# Patient Record
Sex: Female | Born: 1980 | Race: Black or African American | Hispanic: No | Smoking: Never smoker
Health system: Southern US, Community
[De-identification: ages and names within clinical notes are randomized; demographics above are authoritative.]

## PROBLEM LIST (undated history)

## (undated) DIAGNOSIS — D219 Benign neoplasm of connective and other soft tissue, unspecified: Secondary | ICD-10-CM

## (undated) DIAGNOSIS — O24419 Gestational diabetes mellitus in pregnancy, unspecified control: Secondary | ICD-10-CM

## (undated) HISTORY — PX: NO PAST SURGERIES: SHX2092

## (undated) HISTORY — PX: WISDOM TOOTH EXTRACTION: SHX21

## (undated) HISTORY — DX: Gestational diabetes mellitus in pregnancy, unspecified control: O24.419

## (undated) HISTORY — DX: Benign neoplasm of connective and other soft tissue, unspecified: D21.9

---

## 2005-06-12 ENCOUNTER — Emergency Department (HOSPITAL_COMMUNITY): Admission: EM | Admit: 2005-06-12 | Discharge: 2005-06-12 | Payer: Self-pay | Admitting: Family Medicine

## 2005-12-04 ENCOUNTER — Emergency Department (HOSPITAL_COMMUNITY): Admission: EM | Admit: 2005-12-04 | Discharge: 2005-12-04 | Payer: Self-pay | Admitting: Family Medicine

## 2006-01-23 ENCOUNTER — Other Ambulatory Visit: Admission: RE | Admit: 2006-01-23 | Discharge: 2006-01-23 | Payer: Self-pay | Admitting: Gynecology

## 2006-10-05 ENCOUNTER — Other Ambulatory Visit: Admission: RE | Admit: 2006-10-05 | Discharge: 2006-10-05 | Payer: Self-pay | Admitting: Obstetrics and Gynecology

## 2008-11-24 ENCOUNTER — Emergency Department (HOSPITAL_COMMUNITY): Admission: EM | Admit: 2008-11-24 | Discharge: 2008-11-25 | Payer: Self-pay | Admitting: Emergency Medicine

## 2009-04-07 ENCOUNTER — Inpatient Hospital Stay (HOSPITAL_COMMUNITY): Admission: AD | Admit: 2009-04-07 | Discharge: 2009-04-10 | Payer: Self-pay | Admitting: Obstetrics & Gynecology

## 2010-01-11 ENCOUNTER — Emergency Department (HOSPITAL_COMMUNITY): Admission: EM | Admit: 2010-01-11 | Discharge: 2010-01-11 | Payer: Self-pay | Admitting: Emergency Medicine

## 2010-07-06 IMAGING — US US ABDOMEN COMPLETE
1 series · 14 of 25 positions shown · non-contrast
Comparison: None

CLINICAL DATA: Pain.  Back pain.

ABDOMINAL ULTRASOUND COMPLETE

[Series 1: us abdomen complete · 0.28mm/px · 14 of 56 slices shown]
[im 1/56]
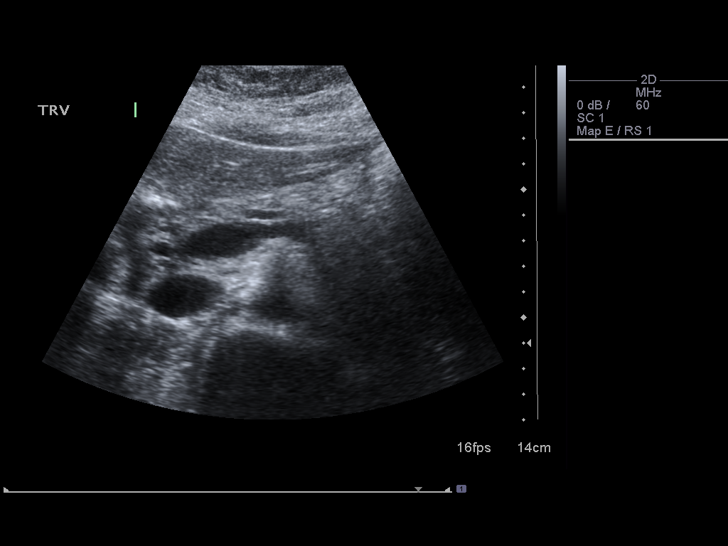
[im 5/56]
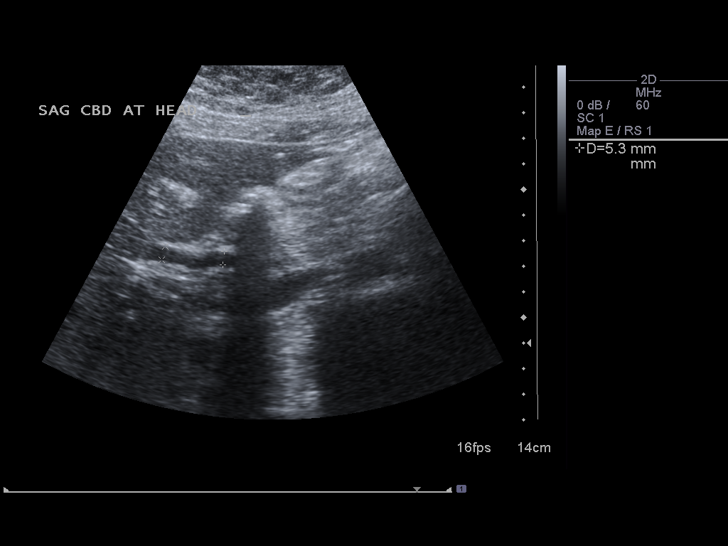
[im 10/56]
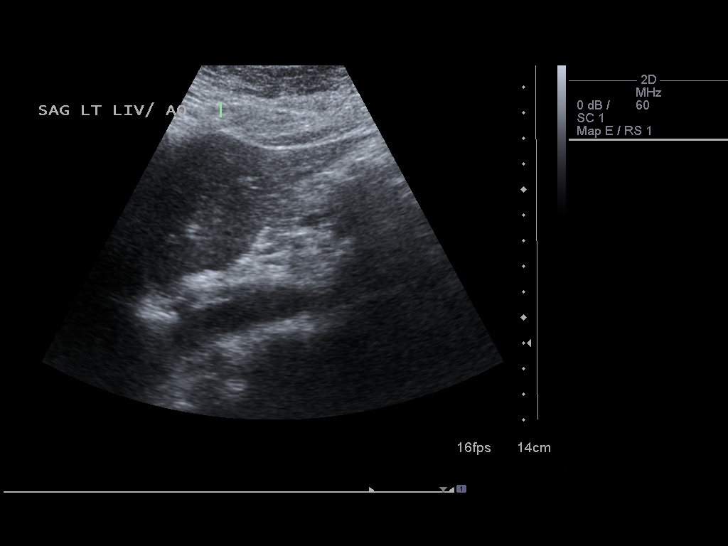
[im 14/56]
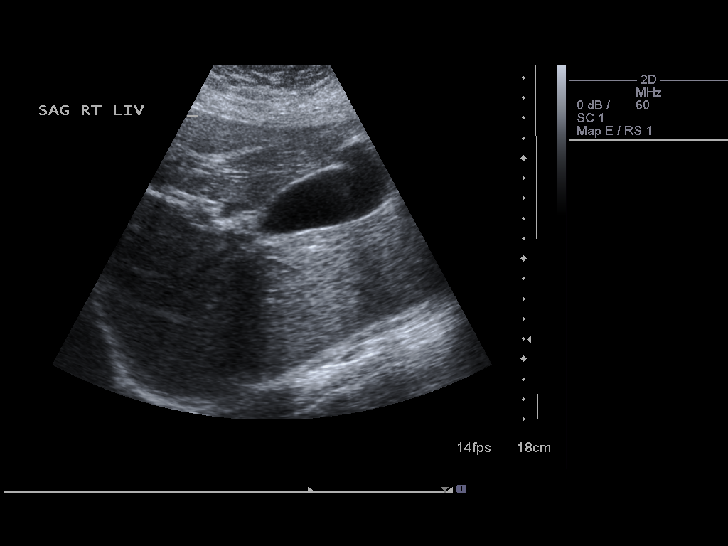
[im 19/56]
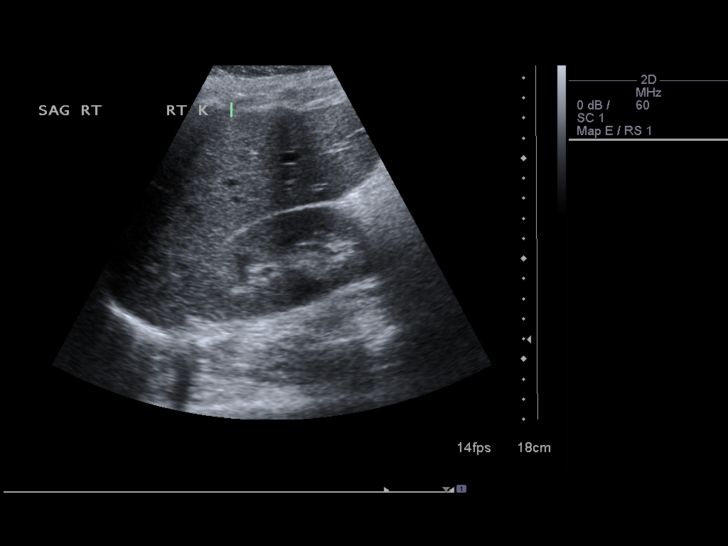
[im 21/56]
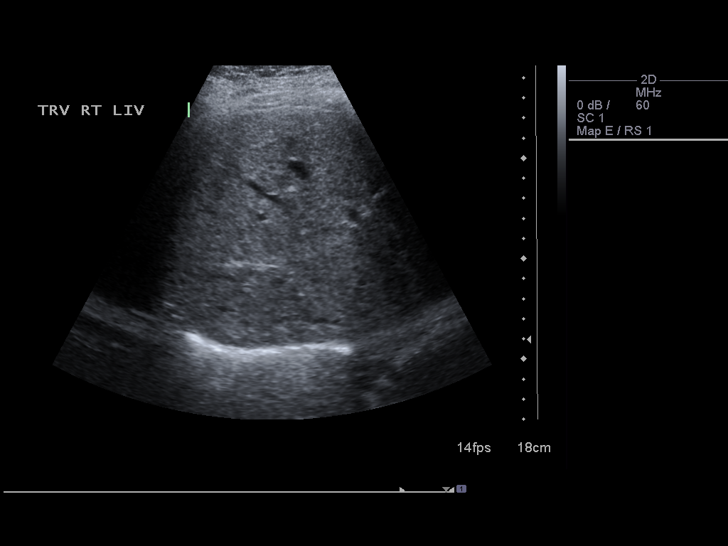
[im 26/56]
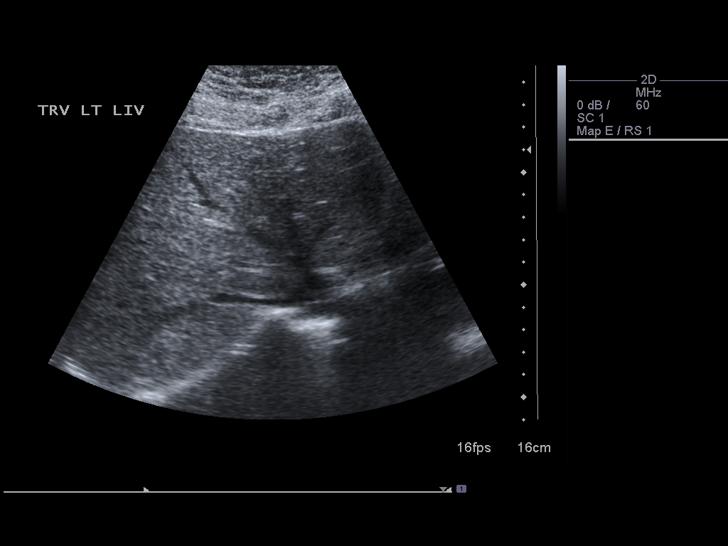
[im 30/56]
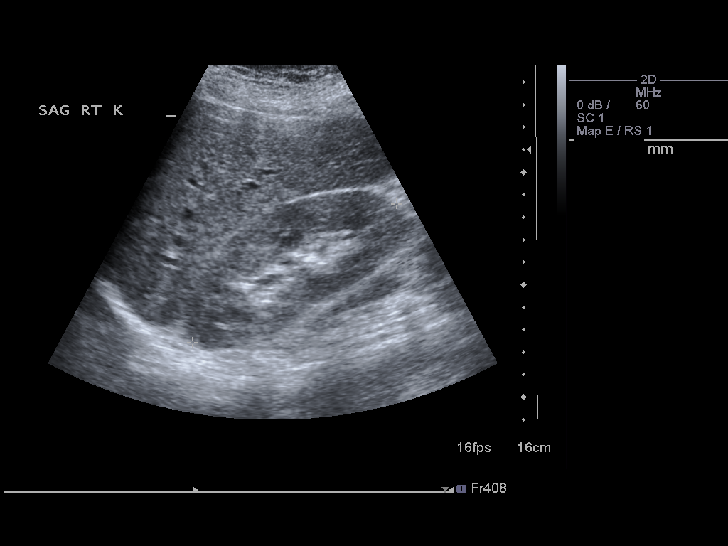
[im 35/56]
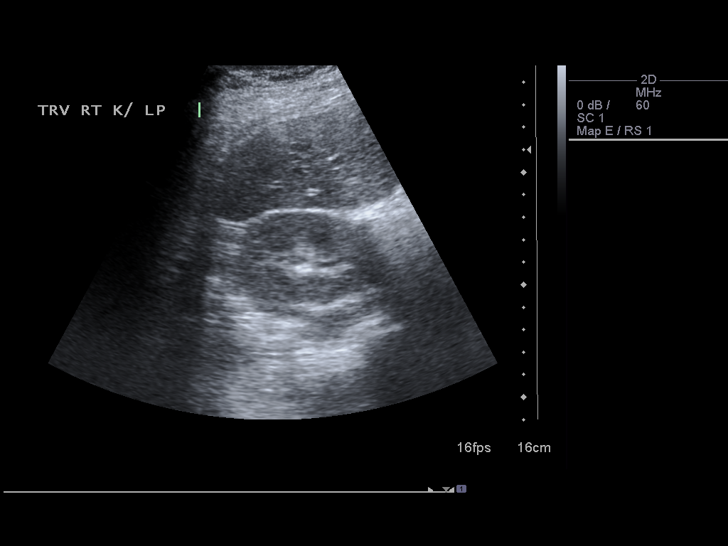
[im 37/56]
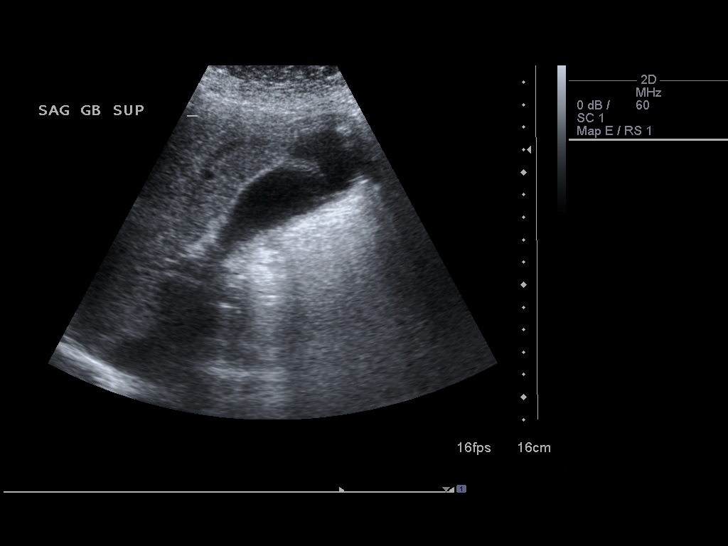
[im 42/56]
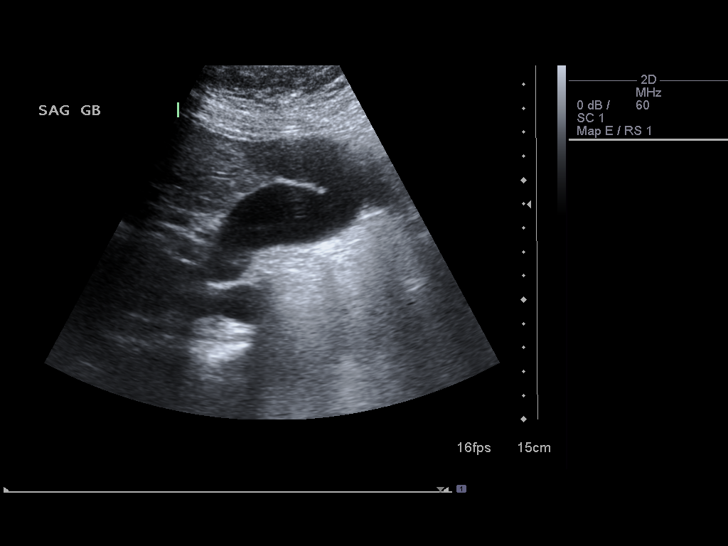
[im 46/56]
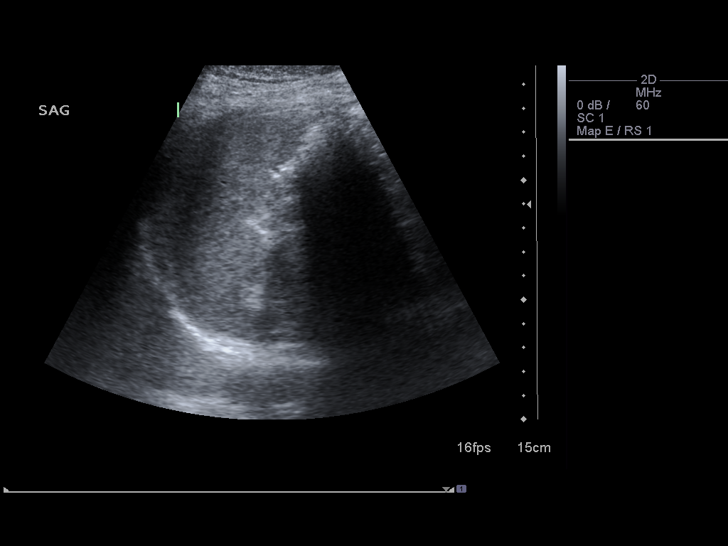
[im 51/56]
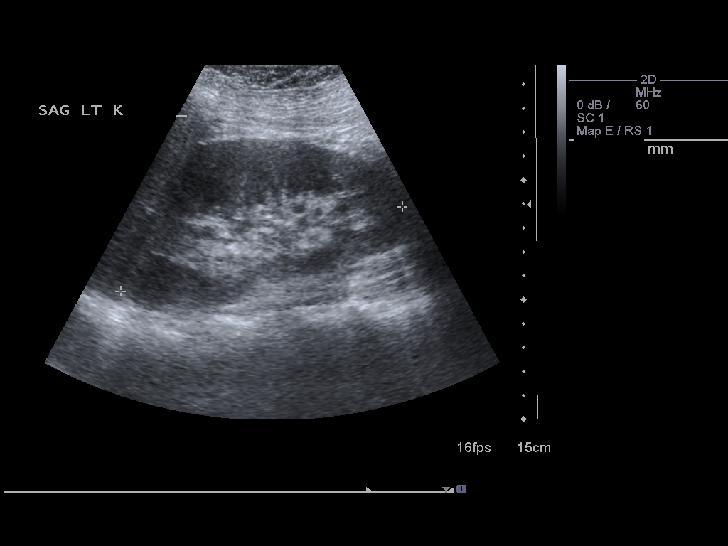
[im 56/56]
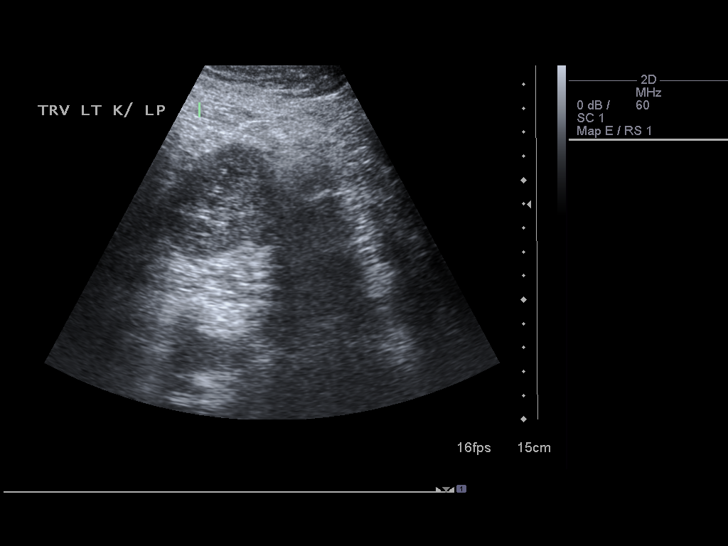

[14 of 25 positions shown; findings below may reference images not displayed]

FINDINGS: Gallbladder:  No gallstones, gallbladder wall thickening, or
pericholecystic fluid.

Common bile duct: Within normal limits in caliber.

Liver:  No focal lesion identified.  Within normal limits in
parenchymal echogenicity.

Inferior vena cava:  Intrahepatic portion appears normal.

Pancreas:  Although entire pancreas not visualized due to bowel
gas, no abnormality identified.

Spleen:  Within normal limits in size and echogenicity.

Right kidney:  Normal in size and parenchymal echogenicity.  No
evidence of mass or hydronephrosis.

Left kidney:  Normal in size and parenchymal echogenicity.   No
evidence of mass or hydronephrosis.

Abdominal aorta:  No aneurysm identified.
IMPRESSION: Negative abdominal ultrasound.

## 2010-11-08 LAB — POCT PREGNANCY, URINE: Preg Test, Ur: NEGATIVE

## 2010-11-27 LAB — CBC
HCT: 25.9 % — ABNORMAL LOW (ref 36.0–46.0)
HCT: 32.8 % — ABNORMAL LOW (ref 36.0–46.0)
Hemoglobin: 8.8 g/dL — ABNORMAL LOW (ref 12.0–15.0)
MCV: 82.1 fL (ref 78.0–100.0)
MCV: 83.9 fL (ref 78.0–100.0)
RDW: 14 % (ref 11.5–15.5)

## 2010-12-01 LAB — DIFFERENTIAL
Eosinophils Absolute: 0.1 10*3/uL (ref 0.0–0.7)
Lymphocytes Relative: 10 % — ABNORMAL LOW (ref 12–46)
Monocytes Absolute: 0.5 10*3/uL (ref 0.1–1.0)
Neutro Abs: 6.5 10*3/uL (ref 1.7–7.7)
Neutrophils Relative %: 83 % — ABNORMAL HIGH (ref 43–77)

## 2010-12-01 LAB — CBC
MCHC: 34.8 g/dL (ref 30.0–36.0)
Platelets: 176 10*3/uL (ref 150–400)
RBC: 3.47 MIL/uL — ABNORMAL LOW (ref 3.87–5.11)

## 2010-12-01 LAB — WET PREP, GENITAL: Trich, Wet Prep: NONE SEEN

## 2010-12-01 LAB — URINE CULTURE
Colony Count: NO GROWTH
Culture: NO GROWTH

## 2010-12-01 LAB — POCT I-STAT, CHEM 8
BUN: 7 mg/dL (ref 6–23)
Glucose, Bld: 94 mg/dL (ref 70–99)
HCT: 30 % — ABNORMAL LOW (ref 36.0–46.0)
Hemoglobin: 10.2 g/dL — ABNORMAL LOW (ref 12.0–15.0)
Potassium: 3.2 mEq/L — ABNORMAL LOW (ref 3.5–5.1)

## 2010-12-01 LAB — URINALYSIS, ROUTINE W REFLEX MICROSCOPIC
Bilirubin Urine: NEGATIVE
Nitrite: NEGATIVE
Protein, ur: NEGATIVE mg/dL
Specific Gravity, Urine: 1.015 (ref 1.005–1.030)
Urobilinogen, UA: 1 mg/dL (ref 0.0–1.0)
pH: 7 (ref 5.0–8.0)

## 2010-12-01 LAB — GC/CHLAMYDIA PROBE AMP, GENITAL: GC Probe Amp, Genital: NEGATIVE

## 2011-08-23 IMAGING — CR DG NECK SOFT TISSUE
1 series · 1 of 1 positions shown · non-contrast
Comparison: None available

CLINICAL DATA: Congestion.  Swollen neck.

NECK SOFT TISSUES - 1+ VIEW

[w soft tissue neck]
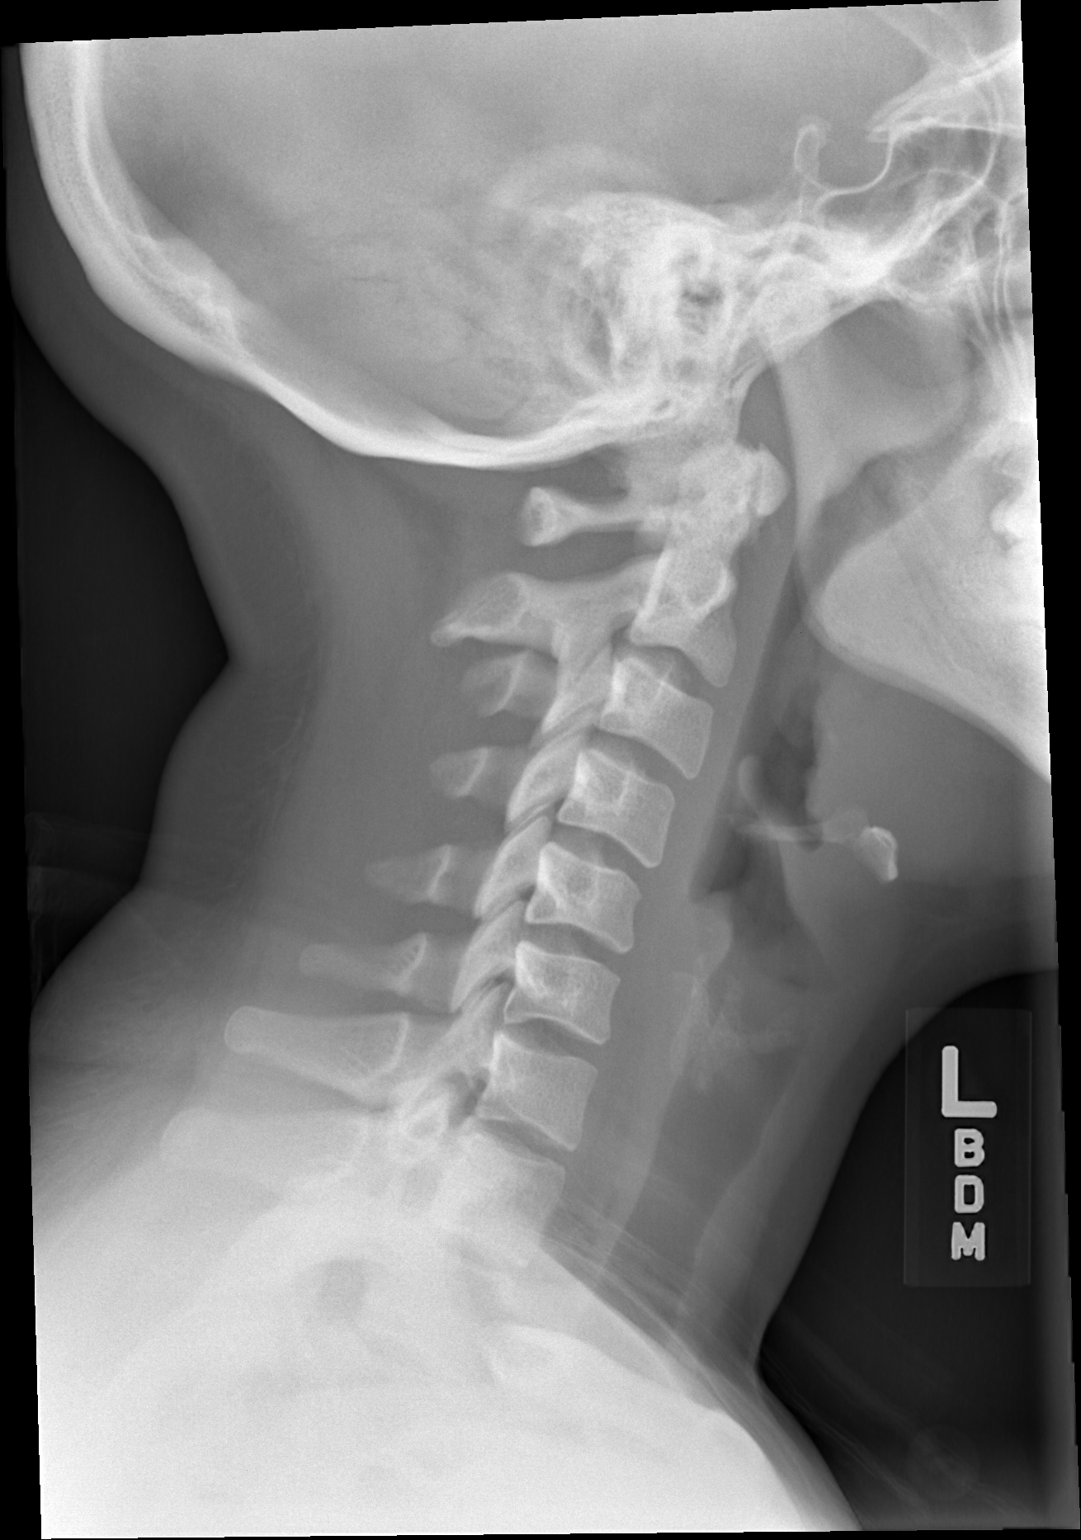

[1 of 1 positions shown; findings below may reference images not displayed]

FINDINGS: Prominent adenoid tissue is noted.  The prevertebral soft
tissues are otherwise within normal limits.  There is some
narrowing of the nasopharyngeal airway as result of the adenoid
tissue.  The epiglottis is within normal limits.  The oropharynx
and hypopharynx is clear.
IMPRESSION: 1.  Prominent adenoid tissue with some narrowing of the
nasopharynx.
2.  The lower airway is patent.

## 2014-05-12 ENCOUNTER — Emergency Department (HOSPITAL_COMMUNITY)
Admission: EM | Admit: 2014-05-12 | Discharge: 2014-05-12 | Disposition: A | Discharge status: Home / Self Care | Payer: Self-pay | Attending: Emergency Medicine | Admitting: Emergency Medicine

## 2014-05-12 ENCOUNTER — Encounter (HOSPITAL_COMMUNITY): Payer: Self-pay | Admitting: Emergency Medicine

## 2014-05-12 DIAGNOSIS — Z3202 Encounter for pregnancy test, result negative: Secondary | ICD-10-CM | POA: Insufficient documentation

## 2014-05-12 DIAGNOSIS — H5710 Ocular pain, unspecified eye: Secondary | ICD-10-CM | POA: Insufficient documentation

## 2014-05-12 DIAGNOSIS — N390 Urinary tract infection, site not specified: Secondary | ICD-10-CM | POA: Insufficient documentation

## 2014-05-12 DIAGNOSIS — L293 Anogenital pruritus, unspecified: Secondary | ICD-10-CM | POA: Insufficient documentation

## 2014-05-12 DIAGNOSIS — H5789 Other specified disorders of eye and adnexa: Secondary | ICD-10-CM | POA: Insufficient documentation

## 2014-05-12 DIAGNOSIS — N760 Acute vaginitis: Secondary | ICD-10-CM | POA: Insufficient documentation

## 2014-05-12 LAB — PREGNANCY, URINE: PREG TEST UR: NEGATIVE

## 2014-05-12 LAB — URINALYSIS, ROUTINE W REFLEX MICROSCOPIC
BILIRUBIN URINE: NEGATIVE
GLUCOSE, UA: NEGATIVE mg/dL
Ketones, ur: NEGATIVE mg/dL
NITRITE: NEGATIVE
PH: 5.5 (ref 5.0–8.0)
PROTEIN: NEGATIVE mg/dL
Specific Gravity, Urine: 1.029 (ref 1.005–1.030)
UROBILINOGEN UA: 1 mg/dL (ref 0.0–1.0)

## 2014-05-12 LAB — URINE MICROSCOPIC-ADD ON

## 2014-05-12 LAB — WET PREP, GENITAL
Clue Cells Wet Prep HPF POC: NONE SEEN
TRICH WET PREP: NONE SEEN
YEAST WET PREP: NONE SEEN

## 2014-05-12 MED ORDER — FLUORESCEIN SODIUM 1 MG OP STRP
1.0000 | ORAL_STRIP | Freq: Once | OPHTHALMIC | Status: AC
  Administered 2014-05-12: 11:00:00 via OPHTHALMIC
  Filled 2014-05-12: qty 1

## 2014-05-12 MED ORDER — LIDOCAINE HCL (PF) 1 % IJ SOLN
INTRAMUSCULAR | Status: AC
  Administered 2014-05-12: 1.8 mL
  Filled 2014-05-12: qty 5

## 2014-05-12 MED ORDER — AZITHROMYCIN 250 MG PO TABS
1000.0000 mg | ORAL_TABLET | Freq: Once | ORAL | Status: AC
  Administered 2014-05-12: 1000 mg via ORAL
  Filled 2014-05-12: qty 4

## 2014-05-12 MED ORDER — TETRACAINE HCL 0.5 % OP SOLN
1.0000 [drp] | Freq: Once | OPHTHALMIC | Status: AC
  Administered 2014-05-12: 1 [drp] via OPHTHALMIC
  Filled 2014-05-12: qty 2

## 2014-05-12 MED ORDER — CEPHALEXIN 500 MG PO CAPS: 500.0000 mg | ORAL_CAPSULE | Freq: Four times a day (QID) | ORAL | Status: DC

## 2014-05-12 MED ORDER — FLUCONAZOLE 100 MG PO TABS
150.0000 mg | ORAL_TABLET | Freq: Once | ORAL | Status: AC
  Administered 2014-05-12: 150 mg via ORAL
  Filled 2014-05-12: qty 2

## 2014-05-12 MED ORDER — LIDOCAINE HCL (PF) 1 % IJ SOLN
5.0000 mL | Freq: Once | INTRAMUSCULAR | Status: AC
  Administered 2014-05-12: 1.8 mL

## 2014-05-12 MED ORDER — CEFTRIAXONE SODIUM 250 MG IJ SOLR
250.0000 mg | Freq: Once | INTRAMUSCULAR | Status: AC
  Administered 2014-05-12: 250 mg via INTRAMUSCULAR
  Filled 2014-05-12: qty 250

## 2014-05-12 MED ORDER — TOBRAMYCIN 0.3 % OP SOLN
2.0000 [drp] | Freq: Once | OPHTHALMIC | Status: AC
  Administered 2014-05-12: 2 [drp] via OPHTHALMIC
  Filled 2014-05-12: qty 5

## 2014-05-12 NOTE — ED Provider Notes (Signed)
Patient seen by Dr. Ashok Cordia, K. Requesting female provider do the pelvic.  Physical Exam  Genitourinary: Uterus normal. Cervix exhibits discharge and friability. Cervix exhibits no motion tenderness. Right adnexum displays no mass, no tenderness and no fullness. Left adnexum displays no mass, no tenderness and no fullness. There is tenderness around the vagina. No erythema or bleeding around the vagina. No foreign body around the vagina. No signs of injury around the vagina. Vaginal discharge (yellow/green) found.    For HPI, ROS, full PE, Diagnosis and dispo please refer to Dr. Albina Billet note.  Linus Mako, PA-C 05/12/14 [phone]

## 2014-05-12 NOTE — Discharge Instructions (Signed)
It was our pleasure to provide your ER care today - we hope that you feel better.  Drink plenty of fluids. Take antibiotic as prescribed (keflex) for urine infection. Take motrin or aleve as need for pain.  Use tobrex eye drops 1-2 drops in each eye 4x/day for the next 5 days. Throw away old pair of contacts, and do not insert new pair until eye symptoms have resolved.  Return to ER if worse, new symptoms, fevers, severe abdominal pain, vomiting, other concern.      Conjunctivitis Conjunctivitis is commonly called "pink eye." Conjunctivitis can be caused by bacterial or viral infection, allergies, or injuries. There is usually redness of the lining of the eye, itching, discomfort, and sometimes discharge. There may be deposits of matter along the eyelids. A viral infection usually causes a watery discharge, while a bacterial infection causes a yellowish, thick discharge. Pink eye is very contagious and spreads by direct contact. You may be given antibiotic eyedrops as part of your treatment. Before using your eye medicine, remove all drainage from the eye by washing gently with warm water and cotton balls. Continue to use the medication until you have awakened 2 mornings in a row without discharge from the eye. Do not rub your eye. This increases the irritation and helps spread infection. Use separate towels from other household members. Wash your hands with soap and water before and after touching your eyes. Use cold compresses to reduce pain and sunglasses to relieve irritation from light. Do not wear contact lenses or wear eye makeup until the infection is gone. SEEK MEDICAL CARE IF:   Your symptoms are not better after 3 days of treatment.  You have increased pain or trouble seeing.  The outer eyelids become very red or swollen. Document Released: 09/15/2004 Document Revised: 10/31/2011 Document Reviewed: 08/08/2005 [facility] Patient Information 2015 [place], [place]. This information is  not intended to replace advice given to you by your health care provider. Make sure you discuss any questions you have with your health care provider.   Corneal Abrasion The cornea is the clear covering at the front and center of the eye. When looking at the colored portion of the eye (iris), you are looking through the cornea. This very thin tissue is made up of many layers. The surface layer is a single layer of cells (corneal epithelium) and is one of the most sensitive tissues in the body. If a scratch or injury causes the corneal epithelium to come off, it is called a corneal abrasion. If the injury extends to the tissues below the epithelium, the condition is called a corneal ulcer. CAUSES   Scratches.  Trauma.  Foreign body in the eye. Some people have recurrences of abrasions in the area of the original injury even after it has healed (recurrent erosion syndrome). Recurrent erosion syndrome generally improves and goes away with time. SYMPTOMS   Eye pain.  Difficulty or inability to keep the injured eye open.  The eye becomes very sensitive to light.  Recurrent erosions tend to happen suddenly, first thing in the morning, usually after waking up and opening the eye. DIAGNOSIS  Your health care provider can diagnose a corneal abrasion during an eye exam. Dye is usually placed in the eye using a drop or a small paper strip moistened by your tears. When the eye is examined with a special light, the abrasion shows up clearly because of the dye. TREATMENT   Small abrasions may be treated with antibiotic drops or  ointment alone.  A pressure patch may be put over the eye. If this is done, follow your doctor's instructions for when to remove the patch. Do not drive or use machines while the eye patch is on. Judging distances is hard to do with a patch on. If the abrasion becomes infected and spreads to the deeper tissues of the cornea, a corneal ulcer can result. This is serious because  it can cause corneal scarring. Corneal scars interfere with light passing through the cornea and cause a loss of vision in the involved eye. HOME CARE INSTRUCTIONS  Use medicine or ointment as directed. Only take over-the-counter or prescription medicines for pain, discomfort, or fever as directed by your health care provider.  Do not drive or operate machinery if your eye is patched. Your ability to judge distances is impaired.  If your health care provider has given you a follow-up appointment, it is very important to keep that appointment. Not keeping the appointment could result in a severe eye infection or permanent loss of vision. If there is any problem keeping the appointment, let your health care provider know. SEEK MEDICAL CARE IF:   You have pain, light sensitivity, and a scratchy feeling in one eye or both eyes.  Your pressure patch keeps loosening up, and you can blink your eye under the patch after treatment.  Any kind of discharge develops from the eye after treatment or if the lids stick together in the morning.  You have the same symptoms in the morning as you did with the original abrasion days, weeks, or months after the abrasion healed. MAKE SURE YOU:   Understand these instructions.  Will watch your condition.  Will get help right away if you are not doing well or get worse. Document Released: 08/05/2000 Document Revised: 08/13/2013 Document Reviewed: 04/15/2013 [facility] Patient Information 2015 [place], [place]. This information is not intended to replace advice given to you by your health care provider. Make sure you discuss any questions you have with your health care provider.   Vaginitis Vaginitis is an inflammation of the vagina. It is most often caused by a change in the normal balance of the bacteria and yeast that live in the vagina. This change in balance causes an overgrowth of certain bacteria or yeast, which causes the inflammation. There are different  types of vaginitis, but the most common types are:  Bacterial vaginosis.  Yeast infection (candidiasis).  Trichomoniasis vaginitis. This is a sexually transmitted infection (STI).  Viral vaginitis.  Atropic vaginitis.  Allergic vaginitis. CAUSES  The cause depends on the type of vaginitis. Vaginitis can be caused by:  Bacteria (bacterial vaginosis).  Yeast (yeast infection).  A parasite (trichomoniasis vaginitis)  A virus (viral vaginitis).  Low hormone levels (atrophic vaginitis). Low hormone levels can occur during pregnancy, breastfeeding, or after menopause.  Irritants, such as bubble baths, scented tampons, and feminine sprays (allergic vaginitis). Other factors can change the normal balance of the yeast and bacteria that live in the vagina. These include:  Antibiotic medicines.  Poor hygiene.  Diaphragms, vaginal sponges, spermicides, birth control pills, and intrauterine devices (IUD).  Sexual intercourse.  Infection.  Uncontrolled diabetes.  A weakened immune system. SYMPTOMS  Symptoms can vary depending on the cause of the vaginitis. Common symptoms include:  Abnormal vaginal discharge.  The discharge is white, gray, or yellow with bacterial vaginosis.  The discharge is thick, white, and cheesy with a yeast infection.  The discharge is frothy and yellow or greenish with  trichomoniasis.  A bad vaginal odor.  The odor is fishy with bacterial vaginosis.  Vaginal itching, pain, or swelling.  Painful intercourse.  Pain or burning when urinating. Sometimes, there are no symptoms. TREATMENT  Treatment will vary depending on the type of infection.   Bacterial vaginosis and trichomoniasis are often treated with antibiotic creams or pills.  Yeast infections are often treated with antifungal medicines, such as vaginal creams or suppositories.  Viral vaginitis has no cure, but symptoms can be treated with medicines that relieve discomfort. Your  sexual partner should be treated as well.  Atrophic vaginitis may be treated with an estrogen cream, pill, suppository, or vaginal ring. If vaginal dryness occurs, lubricants and moisturizing creams may help. You may be told to avoid scented soaps, sprays, or douches.  Allergic vaginitis treatment involves quitting the use of the product that is causing the problem. Vaginal creams can be used to treat the symptoms. HOME CARE INSTRUCTIONS   Take all medicines as directed by your caregiver.  Keep your genital area clean and dry. Avoid soap and only rinse the area with water.  Avoid douching. It can remove the healthy bacteria in the vagina.  Do not use tampons or have sexual intercourse until your vaginitis has been treated. Use sanitary pads while you have vaginitis.  Wipe from front to back. This avoids the spread of bacteria from the rectum to the vagina.  Let air reach your genital area.  Wear cotton underwear to decrease moisture buildup.  Avoid wearing underwear while you sleep until your vaginitis is gone.  Avoid tight pants and underwear or nylons without a cotton panel.  Take off wet clothing (especially bathing suits) as soon as possible.  Use mild, non-scented products. Avoid using irritants, such as:  Scented feminine sprays.  Fabric softeners.  Scented detergents.  Scented tampons.  Scented soaps or bubble baths.  Practice safe sex and use condoms. Condoms may prevent the spread of trichomoniasis and viral vaginitis. SEEK MEDICAL CARE IF:   You have abdominal pain.  You have a fever or persistent symptoms for more than 2-3 days.  You have a fever and your symptoms suddenly get worse. Document Released: 06/05/2007 Document Revised: 05/02/2012 Document Reviewed: 01/19/2012 [facility] Patient Information 2015 [place], [place]. This information is not intended to replace advice given to you by your health care provider. Make sure you discuss any questions you have  with your health care provider.   Urinary Tract Infection Urinary tract infections (UTIs) can develop anywhere along your urinary tract. Your urinary tract is your body's drainage system for removing wastes and extra water. Your urinary tract includes two kidneys, two ureters, a bladder, and a urethra. Your kidneys are a pair of bean-shaped organs. Each kidney is about the size of your fist. They are located below your ribs, one on each side of your spine. CAUSES Infections are caused by microbes, which are microscopic organisms, including fungi, viruses, and bacteria. These organisms are so small that they can only be seen through a microscope. Bacteria are the microbes that most commonly cause UTIs. SYMPTOMS  Symptoms of UTIs may vary by age and gender of the patient and by the location of the infection. Symptoms in young women typically include a frequent and intense urge to urinate and a painful, burning feeling in the bladder or urethra during urination. Older women and men are more likely to be tired, shaky, and weak and have muscle aches and abdominal pain. A fever may mean the  infection is in your kidneys. Other symptoms of a kidney infection include pain in your back or sides below the ribs, nausea, and vomiting. DIAGNOSIS To diagnose a UTI, your caregiver will ask you about your symptoms. Your caregiver also will ask to provide a urine sample. The urine sample will be tested for bacteria and white blood cells. White blood cells are made by your body to help fight infection. TREATMENT  Typically, UTIs can be treated with medication. Because most UTIs are caused by a bacterial infection, they usually can be treated with the use of antibiotics. The choice of antibiotic and length of treatment depend on your symptoms and the type of bacteria causing your infection. HOME CARE INSTRUCTIONS  If you were prescribed antibiotics, take them exactly as your caregiver instructs you. Finish the medication  even if you feel better after you have only taken some of the medication.  Drink enough water and fluids to keep your urine clear or pale yellow.  Avoid caffeine, tea, and carbonated beverages. They tend to irritate your bladder.  Empty your bladder often. Avoid holding urine for long periods of time.  Empty your bladder before and after sexual intercourse.  After a bowel movement, women should cleanse from front to back. Use each tissue only once. SEEK MEDICAL CARE IF:   You have back pain.  You develop a fever.  Your symptoms do not begin to resolve within 3 days. SEEK IMMEDIATE MEDICAL CARE IF:   You have severe back pain or lower abdominal pain.  You develop chills.  You have nausea or vomiting.  You have continued burning or discomfort with urination. MAKE SURE YOU:   Understand these instructions.  Will watch your condition.  Will get help right away if you are not doing well or get worse. Document Released: 05/18/2005 Document Revised: 02/07/2012 Document Reviewed: 09/16/2011 [facility] Patient Information 2015 [place], [place]. This information is not intended to replace advice given to you by your health care provider. Make sure you discuss any questions you have with your health care provider.

## 2014-05-12 NOTE — ED Notes (Addendum)
Tonopen at bedside.  

## 2014-05-12 NOTE — ED Notes (Signed)
Pt states that she has been having vaginal itching for the past two days, states she has had dysuria and pain after intercourse. Denies vaginal bleeding.  Pt states that her eyes also starting bothering her yesterday after church while she was wearing her contacts. States they were itching and burning so she removed them.

## 2014-05-12 NOTE — ED Provider Notes (Addendum)
CSN: [id number]     Arrival date & time 05/12/14  0818 History   First MD Initiated Contact with Patient 05/12/14 0830     Chief Complaint  Patient presents with  . Vaginal Itching  . Eye Problem    irritation     (Consider location/radiation/quality/duration/timing/severity/associated sxs/prior Treatment) Patient is a 33 y.o. female presenting with vaginal itching and eye problem. The history is provided by the patient.  Vaginal Itching Pertinent negatives include no chest pain, no abdominal pain, no headaches and no shortness of breath.  Eye Problem Associated symptoms: discharge, itching and redness   Associated symptoms: no headaches, no photophobia and no vomiting    Pt c/o vaginal irritation for the past few days, and sore after intercourse. No vaginal bleeding or discharge. No genital or perineal ulcers/sores. No abd or pelvic pain. Sl dysuria. No urgency or frequency. No back or flank pain. No fever or chills. No known std exposure. Also notes mod bil eye irritation for the past day.  Noted at church yesterday after wearing contacts. States current pair is about due to be thrown away. +mild drainage. No excessive tearing. No photophobia. No severe eye pain. No nasal congestion, sore throat or uri c/o. No fever or chills. No trauma to eyes or chemical exposure. No fb sensation.  lnmp 2 weeks ago, normal.     History reviewed. No pertinent past medical history. History reviewed. No pertinent past surgical history. No family history on file. History  Substance Use Topics  . Smoking status: Never Smoker   . Smokeless tobacco: Not on file  . Alcohol Use: No   OB History   Grav Para Term Preterm Abortions TAB SAB Ect Mult Living                 Review of Systems  Constitutional: Negative for fever and chills.  HENT: Negative for sore throat.   Eyes: Positive for pain, discharge, redness and itching. Negative for photophobia and visual disturbance.  Respiratory: Negative for  shortness of breath.   Cardiovascular: Negative for chest pain.  Gastrointestinal: Negative for vomiting and abdominal pain.  Genitourinary: Positive for vaginal pain. Negative for flank pain, vaginal bleeding and vaginal discharge.  Musculoskeletal: Negative for back pain and neck pain.  Skin: Negative for rash.  Neurological: Negative for headaches.  Hematological: Does not bruise/bleed easily.  Psychiatric/Behavioral: Negative for confusion.      Allergies  Review of patient's allergies indicates no known allergies.  Home Medications   Prior to Admission medications   Not on File   BP 106/65  Temp(Src) 98.5 F (36.9 C) (Oral)  Resp 14  SpO2 98%  LMP 04/28/2014 Physical Exam  Nursing note and vitals reviewed. Constitutional: She appears well-developed and well-nourished. No distress.  HENT:  Head: Atraumatic.  Nose: Nose normal.  Mouth/Throat: Oropharynx is clear and moist.  Eyes: Conjunctivae and EOM are normal. Pupils are equal, round, and reactive to light. No scleral icterus.  Pupils equal, briskly reactive. No corneal clouding. Contacts in place. bil conj injected. Lids everted, no fb. fluorescein straining, small left corneal ab.  No orbital or periorbital cell.   Neck: Normal range of motion. Neck supple. No tracheal deviation present.  Cardiovascular: Normal rate.   Pulmonary/Chest: Effort normal. No respiratory distress.  Abdominal: Normal appearance. She exhibits no distension.  Musculoskeletal: She exhibits no edema.  Lymphadenopathy:    She has no cervical adenopathy.  Neurological: She is alert.  Skin: Skin is warm and dry. No  rash noted.  Psychiatric: She has a normal mood and affect.    ED Course  Procedures (including critical care time) Labs Review  Results for orders placed during the hospital encounter of 05/12/14  WET PREP, GENITAL      Result Value Ref Range   Yeast Wet Prep HPF POC NONE SEEN  NONE SEEN   Trich, Wet Prep NONE SEEN  NONE  SEEN   Clue Cells Wet Prep HPF POC NONE SEEN  NONE SEEN   WBC, Wet Prep HPF POC TOO NUMEROUS TO COUNT (*) NONE SEEN  URINALYSIS, ROUTINE W REFLEX MICROSCOPIC      Result Value Ref Range   Color, Urine YELLOW  YELLOW   APPearance CLOUDY (*) CLEAR   Specific Gravity, Urine 1.029  1.005 - 1.030   pH 5.5  5.0 - 8.0   Glucose, UA NEGATIVE  NEGATIVE mg/dL   Hgb urine dipstick TRACE (*) NEGATIVE   Bilirubin Urine NEGATIVE  NEGATIVE   Ketones, ur NEGATIVE  NEGATIVE mg/dL   Protein, ur NEGATIVE  NEGATIVE mg/dL   Urobilinogen, UA 1.0  0.0 - 1.0 mg/dL   Nitrite NEGATIVE  NEGATIVE   Leukocytes, UA LARGE (*) NEGATIVE  PREGNANCY, URINE      Result Value Ref Range   Preg Test, Ur NEGATIVE  NEGATIVE  URINE MICROSCOPIC-ADD ON      Result Value Ref Range   Squamous Epithelial / LPF MANY (*) RARE   WBC, UA TOO NUMEROUS TO COUNT  <3 WBC/hpf   RBC / HPF 0-2  <3 RBC/hpf   Bacteria, UA MANY (*) RARE   Urine-Other MANY YEAST         MDM   Reviewed nursing notes and prior charts for additional history.   Pt notes she is about due to change contacts/throw away old pair.  Pt removed contacts in ED, will discard that part. Will rx tobrex drops.   Pelvic exam. Wet prep sent.  Pt requested female provider do pelvic, completed by PA Carlota Raspberry, see noted. No PID noted.  + mod d/c.  Will rx rocephin and zithromax.  Many yeast on ua, will give fluconazole.  Gc/ch pending.  Recheck abd soft nt.  Pt comfortable and stable for d/c.  As tntc wbc on ua, will give rx. cx pending.      Mirna Mires, MD 05/12/14 1130

## 2014-05-13 LAB — GC/CHLAMYDIA PROBE AMP
CT Probe RNA: NEGATIVE
GC PROBE AMP APTIMA: NEGATIVE

## 2014-05-13 NOTE — ED Provider Notes (Signed)
Medical screening examination/treatment/procedure(s) were conducted as a shared visit with non-physician practitioner(s) and myself.  I personally evaluated the patient during the encounter.  See H and P by me.   Mirna Mires, MD 05/13/14 [phone]

## 2014-05-13 NOTE — Discharge Planning (Signed)
[place] to patient regarding primary care resources and establishing care with a provider. Patient states she has a pcp, but has not been seen by the him for some time. Patient states she is looking to obtain insurance in the upcoming months and would like more information. Patient was instructed to contact me Jun 22, 2014, when open enrollment starts for help with the [employer]. Resource guide and orange card application also explained and provided. My contact information given for any future questions or concerns. No other Community Liaison needs identified at this time.

## 2018-08-17 ENCOUNTER — Emergency Department (HOSPITAL_COMMUNITY)
Admission: EM | Admit: 2018-08-17 | Discharge: 2018-08-17 | Disposition: A | Discharge status: Home / Self Care | Payer: Self-pay | Attending: Emergency Medicine | Admitting: Emergency Medicine

## 2018-08-17 ENCOUNTER — Other Ambulatory Visit: Payer: Self-pay

## 2018-08-17 ENCOUNTER — Encounter (HOSPITAL_COMMUNITY): Payer: Self-pay

## 2018-08-17 DIAGNOSIS — N926 Irregular menstruation, unspecified: Secondary | ICD-10-CM

## 2018-08-17 DIAGNOSIS — R1031 Right lower quadrant pain: Secondary | ICD-10-CM | POA: Insufficient documentation

## 2018-08-17 DIAGNOSIS — R109 Unspecified abdominal pain: Secondary | ICD-10-CM

## 2018-08-17 DIAGNOSIS — R1030 Lower abdominal pain, unspecified: Secondary | ICD-10-CM | POA: Insufficient documentation

## 2018-08-17 DIAGNOSIS — N912 Amenorrhea, unspecified: Secondary | ICD-10-CM | POA: Insufficient documentation

## 2018-08-17 LAB — URINALYSIS, ROUTINE W REFLEX MICROSCOPIC
Bilirubin Urine: NEGATIVE
GLUCOSE, UA: NEGATIVE mg/dL
HGB URINE DIPSTICK: NEGATIVE
Ketones, ur: NEGATIVE mg/dL
Nitrite: NEGATIVE
PH: 5 (ref 5.0–8.0)
Protein, ur: 30 mg/dL — AB
SPECIFIC GRAVITY, URINE: 1.028 (ref 1.005–1.030)

## 2018-08-17 LAB — COMPREHENSIVE METABOLIC PANEL
ALT: 25 U/L (ref 0–44)
AST: 20 U/L (ref 15–41)
Albumin: 4.1 g/dL (ref 3.5–5.0)
Alkaline Phosphatase: 71 U/L (ref 38–126)
Anion gap: 9 (ref 5–15)
BILIRUBIN TOTAL: 0.6 mg/dL (ref 0.3–1.2)
BUN: 7 mg/dL (ref 6–20)
CALCIUM: 9.2 mg/dL (ref 8.9–10.3)
CO2: 23 mmol/L (ref 22–32)
Chloride: 108 mmol/L (ref 98–111)
Creatinine, Ser: 1.01 mg/dL — ABNORMAL HIGH (ref 0.44–1.00)
GFR calc Af Amer: >60 mL/min (ref >60)
Glucose, Bld: 93 mg/dL (ref 70–99)
POTASSIUM: 3.6 mmol/L (ref 3.5–5.1)
Sodium: 140 mmol/L (ref 135–145)
Total Protein: 7.9 g/dL (ref 6.5–8.1)

## 2018-08-17 LAB — CBC
HCT: 44.6 % (ref 36.0–46.0)
HEMOGLOBIN: 14 g/dL (ref 12.0–15.0)
MCH: 26.9 pg (ref 26.0–34.0)
MCHC: 31.4 g/dL (ref 30.0–36.0)
MCV: 85.8 fL (ref 80.0–100.0)
Platelets: 293 10*3/uL (ref 150–400)
RBC: 5.2 MIL/uL — ABNORMAL HIGH (ref 3.87–5.11)
RDW: 13.2 % (ref 11.5–15.5)
WBC: 6.5 10*3/uL (ref 4.0–10.5)
nRBC: 0 % (ref 0.0–0.2)

## 2018-08-17 LAB — LIPASE, BLOOD: Lipase: 31 U/L (ref 11–51)

## 2018-08-17 LAB — I-STAT BETA HCG BLOOD, ED (MC, WL, AP ONLY): I-stat hCG, quantitative: <5 m[IU]/mL (ref <5)

## 2018-08-17 NOTE — ED Notes (Signed)
Pt given water and ginger ale for PO challenge. 

## 2018-08-17 NOTE — ED Provider Notes (Signed)
[facility] EMERGENCY DEPARTMENT Provider Note   CSN: [id number] Arrival date & time: 08/17/18  1403     History   Chief Complaint Chief Complaint  Patient presents with  . Abdominal Pain    HPI Katie Jordan is a 37 y.o. female.  HPI   Pt is a 37 y/o female who presents to the ED today c/o lower abd pain that began about 5 days ago. Pain is intermittent. Located to the RLQ. Pain feels like an aching and throbbing pain. Pain lasts for about an hour to 1.5 hours and resolves without intervention. Pain rated at 0/10 currently. At its worst is an 8/10. Pain worse in the morning and at night. Pt states she is concerned because her menses is late and she usually has very regular menses. States she has taken 3 negative pregnancy tests at home. LMP 11/23.  Reports nausea and frequency, but denies vomiting, diarrhea, fevers, dysuria, urgency. Denies vaginal bleeding or abnormal vaginal discharge.   Pt states she has been actively trying to get pregnant so has had recent unprotected intercourse with her female partner who is present in the room.    History reviewed. No pertinent past medical history.  There are no active problems to display for this patient.   History reviewed. No pertinent surgical history.   OB History   No obstetric history on file.      Home Medications    Prior to Admission medications   Medication Sig Start Date End Date Taking? Authorizing Provider  cephALEXin (KEFLEX) 500 MG capsule Take 1 capsule (500 mg total) by mouth 4 (four) times daily. 05/12/14   Lajean Saver, MD    Family History History reviewed. No pertinent family history.  Social History Social History   Tobacco Use  . Smoking status: Never Smoker  . Smokeless tobacco: Never Used  Substance Use Topics  . Alcohol use: No  . Drug use: Not on file     Allergies   Patient has no known allergies.   Review of Systems Review of Systems  Constitutional: Negative  for chills and fever.  HENT: Negative for ear pain and sore throat.   Eyes: Negative for visual disturbance.  Respiratory: Negative for cough and shortness of breath.   Cardiovascular: Negative for chest pain and palpitations.  Gastrointestinal: Positive for abdominal pain and nausea. Negative for blood in stool, constipation, diarrhea and vomiting.  Genitourinary: Positive for frequency. Negative for dysuria, hematuria, urgency, vaginal bleeding and vaginal discharge.  Musculoskeletal: Negative for back pain.  Skin: Negative for color change and rash.  Neurological: Negative for headaches.  All other systems reviewed and are negative.   Physical Exam Updated Vital Signs BP 115/64 (BP Location: Right Arm)   Pulse 62   Temp 98.5 F (36.9 C) (Oral)   Resp 18   LMP 07/14/2018   SpO2 98%   Physical Exam Vitals signs and nursing note reviewed.  Constitutional:      General: She is not in acute distress.    Appearance: She is well-developed. She is obese. She is not ill-appearing or toxic-appearing.  HENT:     Head: Normocephalic and atraumatic.  Eyes:     Conjunctiva/sclera: Conjunctivae normal.  Neck:     Musculoskeletal: Neck supple.  Cardiovascular:     Rate and Rhythm: Normal rate and regular rhythm.     Heart sounds: No murmur.  Pulmonary:     Effort: Pulmonary effort is normal. No respiratory distress.  Breath sounds: Normal breath sounds. No wheezing or rhonchi.  Abdominal:     General: Bowel sounds are normal. There is abdominal bruit.     Palpations: Abdomen is soft.     Tenderness: There is no abdominal tenderness. There is no right CVA tenderness, left CVA tenderness, guarding or rebound.  Skin:    General: Skin is warm and dry.  Neurological:     Mental Status: She is alert.      ED Treatments / Results  Labs (all labs ordered are listed, but only abnormal results are displayed) Labs Reviewed  COMPREHENSIVE METABOLIC PANEL - Abnormal; Notable for the  following components:      Result Value   Creatinine, Ser 1.01 (*)    All other components within normal limits  CBC - Abnormal; Notable for the following components:   RBC 5.20 (*)    All other components within normal limits  URINALYSIS, ROUTINE W REFLEX MICROSCOPIC - Abnormal; Notable for the following components:   APPearance CLOUDY (*)    Protein, ur 30 (*)    Leukocytes, UA MODERATE (*)    Bacteria, UA FEW (*)    All other components within normal limits  URINE CULTURE  LIPASE, BLOOD  I-STAT BETA HCG BLOOD, ED ([facility], [facility], [facility] ONLY)    EKG None  Radiology No results found.  Procedures Procedures (including critical care time)  Medications Ordered in ED Medications - No data to display   Initial Impression / Assessment and Plan / ED Course  I have reviewed the triage vital signs and the nursing notes.  Pertinent labs & imaging results that were available during my care of the patient were reviewed by me and considered in my medical decision making (see chart for details).     Final Clinical Impressions(s) / ED Diagnoses   Final diagnoses:  Abdominal pain, unspecified abdominal location  Missed menses   Patient is presenting with concern of intermittent abdominal pain that has been present for the last several days.  She has no pain currently.  Pain is visit associated with some nausea but no vomiting, diarrhea or urinary symptoms.  She is concerned because she is 3 days late on her menstrual cycle and she is normally very regular.  She has had no vaginal bleeding or discharge.  She is not concerned for STDs.  She has been actively trying to become pregnant with her significant other who is present in the room.  Her abdominal exam is completely benign and there is no tenderness rebound rigidity or guarding.  She is afebrile with normal vital signs.  CBC is without leukocytosis or anemia.  CMP is benign.  Lipase is negative.  Beta hCG is normal.  UA with leukocytes, no  nitrites.  Some proteinuria present.  Patient has 6-10 white blood cells, few bacteria and 6-10 squamous epithelial cells.  Question a contaminated UA.  Patient is denying any urinary symptoms and states she is very well aware of how urinary tract infection feels that she does not feel like she has symptoms and would like to defer antibiotics until the urine culture results.  On reevaluation patient's abdomen remains nontender.  She has been able to tolerate p.o. in the ED.  Given that her abdominal pain has resolved and her exam is benign today, do not feel that further imaging with ultrasound or CT scan is warranted at this time.  Not feel that pelvic exam is warranted at this time given that she does not have  any pain.  Is unclear why her menses is late however she is not pregnant.  Have advised her to follow-up with her OB/GYN and return to the ER for new or worsening symptoms.  She voiced understanding the plan and reasons return the ED.  All questions answered.  ED Discharge Orders    None       Rodney Booze, PA-C 08/17/18 Cowgill, Lang[place], DO 08/17/18 [phone]

## 2018-08-17 NOTE — ED Triage Notes (Signed)
Pt ports she is late on her menstrual cycle that is out of the ordinary. She reports LMP was 11/23. Pt also reports some lower abdominal pain. Pt reports some nausea, no vomiting.

## 2018-08-17 NOTE — Discharge Instructions (Signed)
A culture was sent of your urine today to determine if there is any bacterial growth. If the results of the culture are positive and you require an antibiotic or a change of your prescribed antibiotic you will be contacted by the hospital. If the results are negative you will not be contacted.  Please follow-up with your OB/GYN in regards to your missed menstrual cycle.  Your pregnancy test today was negative.  Please return to the ER sooner if you have any new or worsening symptoms, or if you have any of the following symptoms:  Abdominal pain that does not go away.  You have a fever.  You keep throwing up (vomiting).  The pain is felt only in portions of the abdomen. Pain in the right side could possibly be appendicitis. In an adult, pain in the left lower portion of the abdomen could be colitis or diverticulitis.  You pass bloody or black tarry stools.  There is bright red blood in the stool.  The constipation stays for more than 4 days.  There is belly (abdominal) or rectal pain.  You do not seem to be getting better.  You have any questions or concerns.

## 2018-08-17 NOTE — ED Notes (Signed)
Patient verbalizes understanding of discharge instructions. Opportunity for questioning and answers were provided. Armband removed by staff, pt discharged from ED ambulatory.   

## 2018-08-18 LAB — URINE CULTURE: Culture: <10000 — AB

## 2019-06-23 DIAGNOSIS — O24419 Gestational diabetes mellitus in pregnancy, unspecified control: Secondary | ICD-10-CM

## 2019-06-23 DIAGNOSIS — Z202 Contact with and (suspected) exposure to infections with a predominantly sexual mode of transmission: Secondary | ICD-10-CM

## 2019-06-23 HISTORY — DX: Contact with and (suspected) exposure to infections with a predominantly sexual mode of transmission: Z20.2

## 2019-06-23 HISTORY — DX: Gestational diabetes mellitus in pregnancy, unspecified control: O24.419

## 2019-07-04 ENCOUNTER — Encounter (HOSPITAL_COMMUNITY): Payer: Self-pay | Admitting: Emergency Medicine

## 2019-07-04 ENCOUNTER — Ambulatory Visit (HOSPITAL_COMMUNITY)
Admission: EM | Admit: 2019-07-04 | Discharge: 2019-07-04 | Disposition: A | Discharge status: Home / Self Care | Payer: Medicaid Other | Attending: Internal Medicine | Admitting: Internal Medicine

## 2019-07-04 ENCOUNTER — Other Ambulatory Visit: Payer: Self-pay

## 2019-07-04 DIAGNOSIS — Z20828 Contact with and (suspected) exposure to other viral communicable diseases: Secondary | ICD-10-CM | POA: Insufficient documentation

## 2019-07-04 DIAGNOSIS — J111 Influenza due to unidentified influenza virus with other respiratory manifestations: Secondary | ICD-10-CM | POA: Diagnosis not present

## 2019-07-04 DIAGNOSIS — Z3A11 11 weeks gestation of pregnancy: Secondary | ICD-10-CM | POA: Diagnosis not present

## 2019-07-04 DIAGNOSIS — R509 Fever, unspecified: Secondary | ICD-10-CM | POA: Diagnosis not present

## 2019-07-04 DIAGNOSIS — R519 Headache, unspecified: Secondary | ICD-10-CM | POA: Diagnosis not present

## 2019-07-04 DIAGNOSIS — O26891 Other specified pregnancy related conditions, first trimester: Secondary | ICD-10-CM | POA: Insufficient documentation

## 2019-07-04 NOTE — ED Provider Notes (Signed)
[name]    CSN: [id number] Arrival date & time: 07/04/19  1632      History   Chief Complaint Chief Complaint  Patient presents with  . Fever    HPI Katie Jordan is a 38 y.o. female who is currently [redacted] weeks pregnant comes to urgent care with complaints of low-grade fever over the past few of days.  Patient's temperature has been 99.6 Fahrenheit at home.  She had some abdominal cramping which is resolved.  She started having some headaches and generalized body pains which is improved with Tylenol.  She continues to have low-grade fever.  Appetite is preserved.  She admits to having sick contact with a family member who had similar symptoms.  Patient denies any nausea, vomiting.  No diarrhea.   HPI  History reviewed. No pertinent past medical history.  There are no active problems to display for this patient.   History reviewed. No pertinent surgical history.  OB History    Gravida  1   Para      Term      Preterm      AB      Living        SAB      TAB      Ectopic      Multiple      Live Births               Home Medications    Prior to Admission medications   Not on File    Family History No family history on file.  Social History Social History   Tobacco Use  . Smoking status: Never Smoker  . Smokeless tobacco: Never Used  Substance Use Topics  . Alcohol use: No  . Drug use: Not on file     Allergies   Patient has no known allergies.   Review of Systems Review of Systems  Constitutional: Positive for fever. Negative for activity change and fatigue.  HENT: Negative.   Respiratory: Negative.  Negative for chest tightness, shortness of breath and wheezing.   Cardiovascular: Negative.   Gastrointestinal: Negative.   Genitourinary: Negative.   Musculoskeletal: Positive for arthralgias and myalgias.  Skin: Negative.   Neurological: Positive for headaches.  Psychiatric/Behavioral: Negative for confusion and  decreased concentration.     Physical Exam Triage Vital Signs ED Triage Vitals  Enc Vitals Group     BP 07/04/19 1740 117/75     Pulse Rate 07/04/19 1740 97     Resp 07/04/19 1740 18     Temp 07/04/19 1740 97.6 F (36.4 C)     Temp src --      SpO2 07/04/19 1740 100 %     Weight --      Height --      Head Circumference --      Peak Flow --      Pain Score 07/04/19 1741 0     Pain Loc --      Pain Edu? --      Excl. in [place]? --    No data found.  Updated Vital Signs BP 117/75   Pulse 97   Temp 97.6 F (36.4 C)   Resp 18   LMP  (Approximate) Comment: 11 weeks aprox  SpO2 100%   Visual Acuity Right Eye Distance:   Left Eye Distance:   Bilateral Distance:    Right Eye Near:   Left Eye Near:    Bilateral Near:  Physical Exam Constitutional:      Appearance: Normal appearance.  Cardiovascular:     Rate and Rhythm: Normal rate and regular rhythm.     Pulses: Normal pulses.     Heart sounds: Normal heart sounds. No murmur. No friction rub.  Pulmonary:     Effort: Pulmonary effort is normal.     Breath sounds: Normal breath sounds. No wheezing or rhonchi.  Abdominal:     General: Bowel sounds are normal.     Palpations: Abdomen is soft.     Tenderness: There is no abdominal tenderness.     Hernia: No hernia is present.  Musculoskeletal: Normal range of motion.        General: No swelling or signs of injury.  Skin:    General: Skin is warm.     Capillary Refill: Capillary refill takes less than 2 seconds.     Findings: No bruising or erythema.  Neurological:     Mental Status: She is alert and oriented to person, place, and time. Mental status is at baseline.      UC Treatments / Results  Labs (all labs ordered are listed, but only abnormal results are displayed) Labs Reviewed  NOVEL CORONAVIRUS, NAA (HOSP ORDER, SEND-OUT TO REF LAB; TAT 18-24 HRS)    EKG   Radiology No results found.  Procedures Procedures (including critical care time)   Medications Ordered in UC Medications - No data to display  Initial Impression / Assessment and Plan / UC Course  I have reviewed the triage vital signs and the nursing notes.  Pertinent labs & imaging results that were available during my care of the patient were reviewed by me and considered in my medical decision making (see chart for details).     1.  Flulike illness: Tylenol as needed for fever and body aches Patient is encouraged to follow-up with OB/GYN for routine OB care COVID-19 testing has been done Patient is advised to self isolate until the COVID-19 test results are available. If patient has any worsening symptoms she is encouraged to return to urgent care through video visit or in person. Final Clinical Impressions(s) / UC Diagnoses   Final diagnoses:  Influenza-like illness   Discharge Instructions   None    ED Prescriptions    None     PDMP not reviewed this encounter.   Chase Picket, MD 07/04/19 [phone]

## 2019-07-04 NOTE — ED Triage Notes (Addendum)
Pt states she had a fever at home since Sunday of "99.6", also some lower abdominal cramping, states she cramping is gone but shes had some headaches,. Called her OB about the cramping and they did not seem concerned. She is [redacted] weeks pregnant.

## 2019-07-06 LAB — NOVEL CORONAVIRUS, NAA (HOSP ORDER, SEND-OUT TO REF LAB; TAT 18-24 HRS): SARS-CoV-2, NAA: NOT DETECTED

## 2019-07-15 LAB — OB RESULTS CONSOLE HEPATITIS B SURFACE ANTIGEN: Hepatitis B Surface Ag: NEGATIVE

## 2019-07-15 LAB — OB RESULTS CONSOLE RPR: RPR: NONREACTIVE

## 2019-07-15 LAB — OB RESULTS CONSOLE ABO/RH: RH Type: POSITIVE

## 2019-07-15 LAB — OB RESULTS CONSOLE GC/CHLAMYDIA
Chlamydia: NEGATIVE
Gonorrhea: NEGATIVE

## 2019-07-15 LAB — OB RESULTS CONSOLE RUBELLA ANTIBODY, IGM: Rubella: IMMUNE

## 2019-07-15 LAB — OB RESULTS CONSOLE ANTIBODY SCREEN: Antibody Screen: NEGATIVE

## 2019-07-15 LAB — OB RESULTS CONSOLE HIV ANTIBODY (ROUTINE TESTING): HIV: NONREACTIVE

## 2019-07-31 ENCOUNTER — Other Ambulatory Visit: Payer: Self-pay

## 2019-07-31 ENCOUNTER — Encounter: Payer: Self-pay | Admitting: Registered"

## 2019-07-31 ENCOUNTER — Encounter: Payer: Medicaid Other | Attending: Obstetrics & Gynecology | Admitting: Registered"

## 2019-07-31 DIAGNOSIS — O99810 Abnormal glucose complicating pregnancy: Secondary | ICD-10-CM | POA: Diagnosis not present

## 2019-07-31 NOTE — Progress Notes (Signed)
Patient was seen on 07/31/19 for Gestational Diabetes self-management class at the [facility]. The following learning objectives were met by the patient during this course:   States the definition of Gestational Diabetes  States why dietary management is important in controlling blood glucose  Describes the effects each nutrient has on blood glucose levels  Demonstrates ability to create a balanced meal plan  Demonstrates carbohydrate counting   States when to check blood glucose levels  Demonstrates proper blood glucose monitoring techniques  States the effect of stress and exercise on blood glucose levels  States the importance of limiting caffeine and abstaining from alcohol and smoking  Blood glucose monitor given: none  Patient instructed to monitor glucose levels: FBS: 60 - <95; 1 hour: <140; 2 hour: <120  Patient received handouts:  Nutrition Diabetes and Pregnancy, including carb counting list  Patient will be seen for follow-up as needed.

## 2019-08-04 ENCOUNTER — Encounter (HOSPITAL_COMMUNITY): Payer: Self-pay | Admitting: Obstetrics

## 2019-08-04 ENCOUNTER — Inpatient Hospital Stay (HOSPITAL_COMMUNITY)
Admission: AD | Admit: 2019-08-04 | Discharge: 2019-08-04 | Disposition: A | Discharge status: Home / Self Care | Payer: Medicaid Other | Attending: Obstetrics | Admitting: Obstetrics

## 2019-08-04 ENCOUNTER — Other Ambulatory Visit: Payer: Self-pay

## 2019-08-04 DIAGNOSIS — B373 Candidiasis of vulva and vagina: Secondary | ICD-10-CM | POA: Insufficient documentation

## 2019-08-04 DIAGNOSIS — Z8632 Personal history of gestational diabetes: Secondary | ICD-10-CM | POA: Diagnosis not present

## 2019-08-04 DIAGNOSIS — R109 Unspecified abdominal pain: Secondary | ICD-10-CM

## 2019-08-04 DIAGNOSIS — Z3A15 15 weeks gestation of pregnancy: Secondary | ICD-10-CM

## 2019-08-04 DIAGNOSIS — O26892 Other specified pregnancy related conditions, second trimester: Secondary | ICD-10-CM

## 2019-08-04 DIAGNOSIS — D259 Leiomyoma of uterus, unspecified: Secondary | ICD-10-CM | POA: Insufficient documentation

## 2019-08-04 DIAGNOSIS — O3412 Maternal care for benign tumor of corpus uteri, second trimester: Secondary | ICD-10-CM | POA: Insufficient documentation

## 2019-08-04 DIAGNOSIS — O98812 Other maternal infectious and parasitic diseases complicating pregnancy, second trimester: Secondary | ICD-10-CM | POA: Diagnosis not present

## 2019-08-04 DIAGNOSIS — O2342 Unspecified infection of urinary tract in pregnancy, second trimester: Secondary | ICD-10-CM | POA: Insufficient documentation

## 2019-08-04 DIAGNOSIS — Z833 Family history of diabetes mellitus: Secondary | ICD-10-CM | POA: Diagnosis not present

## 2019-08-04 HISTORY — DX: Benign neoplasm of connective and other soft tissue, unspecified: D21.9

## 2019-08-04 LAB — CBC
HCT: 38.9 % (ref 36.0–46.0)
Hemoglobin: 13 g/dL (ref 12.0–15.0)
MCH: 28.3 pg (ref 26.0–34.0)
MCHC: 33.4 g/dL (ref 30.0–36.0)
MCV: 84.6 fL (ref 80.0–100.0)
Platelets: 246 10*3/uL (ref 150–400)
RBC: 4.6 MIL/uL (ref 3.87–5.11)
RDW: 13.2 % (ref 11.5–15.5)
WBC: 10.7 10*3/uL — ABNORMAL HIGH (ref 4.0–10.5)
nRBC: 0 % (ref 0.0–0.2)

## 2019-08-04 LAB — URINALYSIS, ROUTINE W REFLEX MICROSCOPIC
Bilirubin Urine: NEGATIVE
Glucose, UA: 50 mg/dL — AB
Hgb urine dipstick: NEGATIVE
Ketones, ur: 20 mg/dL — AB
Nitrite: NEGATIVE
Protein, ur: 100 mg/dL — AB
Specific Gravity, Urine: 1.026 (ref 1.005–1.030)
pH: 5 (ref 5.0–8.0)

## 2019-08-04 LAB — WET PREP, GENITAL
Clue Cells Wet Prep HPF POC: NONE SEEN
Sperm: NONE SEEN
Trich, Wet Prep: NONE SEEN

## 2019-08-04 LAB — OB RESULTS CONSOLE GBS: GBS: POSITIVE

## 2019-08-04 MED ORDER — ACETAMINOPHEN 325 MG PO TABS
650.0000 mg | ORAL_TABLET | Freq: Once | ORAL | Status: AC
  Administered 2019-08-04: 650 mg via ORAL
  Filled 2019-08-04: qty 2

## 2019-08-04 MED ORDER — CYCLOBENZAPRINE HCL 10 MG PO TABS
10.0000 mg | ORAL_TABLET | Freq: Once | ORAL | Status: AC
  Administered 2019-08-04: 10:00:00 10 mg via ORAL
  Filled 2019-08-04: qty 1

## 2019-08-04 MED ORDER — TERCONAZOLE 0.8 % VA CREA: 1.0000 | TOPICAL_CREAM | Freq: Every day | VAGINAL | 0 refills | Status: DC

## 2019-08-04 MED ORDER — CEPHALEXIN 500 MG PO CAPS: 500.0000 mg | ORAL_CAPSULE | Freq: Four times a day (QID) | ORAL | 2 refills | Status: DC

## 2019-08-04 MED ORDER — CYCLOBENZAPRINE HCL 10 MG PO TABS: 10.0000 mg | ORAL_TABLET | Freq: Three times a day (TID) | ORAL | 2 refills | Status: AC | PRN

## 2019-08-04 MED ORDER — ACETAMINOPHEN 325 MG PO TABS: 650.0000 mg | ORAL_TABLET | ORAL | 2 refills | Status: AC | PRN

## 2019-08-04 NOTE — MAU Provider Note (Signed)
History     CSN: [id number]  Arrival date and time: 08/04/19 [id number]   First Provider Initiated Contact with Patient 08/04/19 [phone]      Chief Complaint  Patient presents with  . Abdominal Pain   HPI Katie Jordan is a 38 y.o. G2P1001 at [redacted]w[redacted]d who presents to MAU with chief complaint of abdominal pain. This is a new problem, onset Thursday. Patient endorses bilateral soreness at her round ligaments "like someone is pinching me". Her pain radiates towards her iliac crests. Pain is worsened by lying flat, standing, and walking for prolonged periods of time.  Patient reports diarrhea yesterday which resolved without intervention. She states she was recently informed that she has two "good size" fibroids attached to her uterus. She is also s/p treatment for Trichomonas about two weeks ago.  She has not had intercourse since her diagnosis.  She denies vaginal bleeding, abnormal vaginal discharge, decreased fetal movement, dysuria, fever, falls, or recent illness.   Her next appointment with [facility] is 08/13/2019.  OB History    Gravida  2   Para  1   Term  1   Preterm      AB      Living  1     SAB      TAB      Ectopic      Multiple      Live Births  1           Past Medical History:  Diagnosis Date  . Fibroids   . GDM (gestational diabetes mellitus) 06/2019  . Trichomonas contact 06/2019   Treated     Past Surgical History:  Procedure Laterality Date  . NO PAST SURGERIES      Family History  Problem Relation Age of Onset  . Diabetes Mother     Social History   Tobacco Use  . Smoking status: Never Smoker  . Smokeless tobacco: Never Used  Substance Use Topics  . Alcohol use: No  . Drug use: Never    Allergies: No Known Allergies  Medications Prior to Admission  Medication Sig Dispense Refill Last Dose  . acetaminophen (TYLENOL) 500 MG tablet Take 500 mg by mouth every 6 (six) hours as needed.   08/04/2019 at 0400    Review of  Systems  Constitutional: Negative for chills, fatigue and fever.  Respiratory: Negative for shortness of breath.   Gastrointestinal: Positive for abdominal pain.  Genitourinary: Positive for frequency. Negative for decreased urine volume, difficulty urinating, dysuria, flank pain, urgency, vaginal bleeding, vaginal discharge and vaginal pain.  Musculoskeletal: Negative for back pain.  All other systems reviewed and are negative.  Physical Exam   Blood pressure 119/80, pulse 97, temperature 98.8 F (37.1 C), temperature source Oral, resp. rate 18, height 5' (1.524 m), weight 94.4 kg, SpO2 97 %.  Physical Exam  Nursing note and vitals reviewed. Constitutional: She is oriented to person, place, and time. She appears well-developed and well-nourished.  Cardiovascular: Normal rate.  Respiratory: Effort normal and breath sounds normal.  GI: Soft. She exhibits no distension. There is abdominal tenderness in the suprapubic area. There is no rigidity, no rebound, no guarding, no CVA tenderness, no tenderness at McBurney's point and negative Murphy's sign.  Neurological: She is alert and oriented to person, place, and time.  Skin: Skin is warm and dry.  Psychiatric: She has a normal mood and affect. Her behavior is normal. Judgment and thought content normal.    MAU Course/MDM  Procedures  --Pertinent negatives: dysuria, fever, flank pain, CVAT, hematuria --Elevated leuks and WBCs on UA. Discussed option of holding antibiotics until urine culture results are obtained. Based on UA results, urinary frequency, degree of abdominal tenderness and discomfort, advised starting antibiotics today. Patient agreeable --Patient sleeping after Tylenol and Flexeril. Requests short course. Reviewed side effects including sleepiness in some patients. --New diagnosis A1GDM. Discussed associated between elevated/poorly controlled CBGs and yeast infections   Patient Vitals for the past 24 hrs:  BP Temp Temp src  Pulse Resp SpO2 Height Weight  08/04/19 1017 119/83 -- -- 98 -- -- -- --  08/04/19 0858 119/80 98.8 F (37.1 C) Oral 97 18 97 % -- --  08/04/19 0830 111/71 98.4 F (36.9 C) Oral 100 16 98 % -- --  08/04/19 0826 -- -- -- -- -- -- 5' (1.524 m) 94.4 kg   Results for orders placed or performed during the hospital encounter of 08/04/19 (from the past 24 hour(s))  Urinalysis, Routine w reflex microscopic     Status: Abnormal   Collection Time: 08/04/19  8:45 AM  Result Value Ref Range   Color, Urine YELLOW YELLOW   APPearance HAZY (A) CLEAR   Specific Gravity, Urine 1.026 1.005 - 1.030   pH 5.0 5.0 - 8.0   Glucose, UA 50 (A) NEGATIVE mg/dL   Hgb urine dipstick NEGATIVE NEGATIVE   Bilirubin Urine NEGATIVE NEGATIVE   Ketones, ur 20 (A) NEGATIVE mg/dL   Protein, ur 100 (A) NEGATIVE mg/dL   Nitrite NEGATIVE NEGATIVE   Leukocytes,Ua LARGE (A) NEGATIVE   RBC / HPF 6-10 0 - 5 RBC/hpf   WBC, UA 6-10 0 - 5 WBC/hpf   Bacteria, UA RARE (A) NONE SEEN   Squamous Epithelial / LPF 0-5 0 - 5   Mucus PRESENT   Wet prep, genital     Status: Abnormal   Collection Time: 08/04/19  9:31 AM   Specimen: PATH Cytology Cervicovaginal Ancillary Only  Result Value Ref Range   Yeast Wet Prep HPF POC PRESENT (A) NONE SEEN   Trich, Wet Prep NONE SEEN NONE SEEN   Clue Cells Wet Prep HPF POC NONE SEEN NONE SEEN   WBC, Wet Prep HPF POC MANY (A) NONE SEEN   Sperm NONE SEEN   CBC     Status: Abnormal   Collection Time: 08/04/19  9:40 AM  Result Value Ref Range   WBC 10.7 (H) 4.0 - 10.5 K/uL   RBC 4.60 3.87 - 5.11 MIL/uL   Hemoglobin 13.0 12.0 - 15.0 g/dL   HCT 38.9 36.0 - 46.0 %   MCV 84.6 80.0 - 100.0 fL   MCH 28.3 26.0 - 34.0 pg   MCHC 33.4 30.0 - 36.0 g/dL   RDW 13.2 11.5 - 15.5 %   Platelets 246 150 - 400 K/uL   nRBC 0.0 0.0 - 0.2 %   Meds ordered this encounter  Medications  . acetaminophen (TYLENOL) tablet 650 mg  . cyclobenzaprine (FLEXERIL) tablet 10 mg  . terconazole (TERAZOL 3) 0.8 % vaginal  cream    Sig: Place 1 applicator vaginally at bedtime. Apply nightly for three nights.    Dispense:  20 g    Refill:  0    Order Specific Question:   Supervising Provider    Answer:   Sloan Leiter [id number]  . cephALEXin (KEFLEX) 500 MG capsule    Sig: Take 1 capsule (500 mg total) by mouth 4 (four) times daily.    Dispense:  28 capsule    Refill:  2    Order Specific Question:   Supervising Provider    Answer:   Sloan Leiter [id number]  . acetaminophen (TYLENOL) 325 MG tablet    Sig: Take 2 tablets (650 mg total) by mouth every 4 (four) hours as needed.    Dispense:  100 tablet    Refill:  2    Order Specific Question:   Supervising Provider    Answer:   Sloan Leiter [id number]  . cyclobenzaprine (FLEXERIL) 10 MG tablet    Sig: Take 1 tablet (10 mg total) by mouth 3 (three) times daily as needed for muscle spasms.    Dispense:  30 tablet    Refill:  2    Order Specific Question:   Supervising Provider    Answer:   Sloan Leiter [id number]   Assessment and Plan  --38 y.o. G2P1001 at [redacted]w[redacted]d  --[address] by Doppler --UTI --Vulvovaginal candidiasis --Uterine fibroids --Urine culture in work --Discharge home in stable condition  F/U: --Next appt with [facility] 08/13/19  Darlina Rumpf, [place] 08/04/2019, 11:57 AM

## 2019-08-04 NOTE — MAU Note (Signed)
Katie Jordan is a 38 y.o. at [redacted]w[redacted]d here in MAU reporting: since Friday has been having abdominal pain. Has tried tylenol but it has not helped. Denies bleeding, LOF, or discharge. Increased urinary frequency but no urgency or dysuria.   Onset of complaint: ongoing since Friday  Pain score: 7/10  Vitals:   08/04/19 0830  BP: 111/71  Pulse: 100  Resp: 16  Temp: 98.4 F (36.9 C)  SpO2: 98%     Lab orders placed from triage: UA

## 2019-08-04 NOTE — Discharge Instructions (Signed)
Pregnancy and Urinary Tract Infection  A urinary tract infection (UTI) is an infection of any part of the urinary tract. This includes the kidneys, the tubes that connect your kidneys to your bladder (ureters), the bladder, and the tube that carries urine out of your body (urethra). These organs make, store, and get rid of urine in the body. Your health care provider may use other names to describe the infection. An upper UTI affects the ureters and kidneys (pyelonephritis). A lower UTI affects the bladder (cystitis) and urethra (urethritis). Most urinary tract infections are caused by bacteria in your genital area, around the entrance to your urinary tract (urethra). These bacteria grow and cause irritation and inflammation of your urinary tract. You are more likely to develop a UTI during pregnancy because the physical and hormonal changes your body goes through can make it easier for bacteria to get into your urinary tract. Your growing baby also puts pressure on your bladder and can affect urine flow. It is important to recognize and treat UTIs in pregnancy because of the risk of serious complications for both you and your baby. How does this affect me? Symptoms of a UTI include:  Needing to urinate right away (urgently).  Frequent urination or passing small amounts of urine frequently.  Pain or burning with urination.  Blood in the urine.  Urine that smells bad or unusual.  Trouble urinating.  Cloudy urine.  Pain in the abdomen or lower back.  Vaginal discharge. You may also have:  Vomiting or a decreased appetite.  Confusion.  Irritability or tiredness.  A fever.  Diarrhea. How does this affect my baby? An untreated UTI during pregnancy could lead to a kidney infection or a systemic infection, which can cause health problems that could affect your baby. Possible complications of an untreated UTI include:  Giving birth to your baby before 37 weeks of pregnancy  (premature).  Having a baby with a low birth weight.  Developing high blood pressure during pregnancy (preeclampsia).  Having a low hemoglobin level (anemia). What can I do to lower my risk? To prevent a UTI:  Go to the bathroom as soon as you feel the need. Do not hold urine for long periods of time.  Always wipe from front to back, especially after a bowel movement. Use each tissue one time when you wipe.  Empty your bladder after sex.  Keep your genital area dry.  Drink 6-10 glasses of water each day.  Do not douche or use deodorant sprays. How is this treated? Treatment for this condition may include:  Antibiotic medicines that are safe to take during pregnancy.  Other medicines to treat less common causes of UTI. Follow these instructions at home:  If you were prescribed an antibiotic medicine, take it as told by your health care provider. Do not stop using the antibiotic even if you start to feel better.  Keep all follow-up visits as told by your health care provider. This is important. Contact a health care provider if:  Your symptoms do not improve or they get worse.  You have abnormal vaginal discharge. Get help right away if you:  Have a fever.  Have nausea and vomiting.  Have back or side pain.  Feel contractions in your uterus.  Have lower belly pain.  Have a gush of fluid from your vagina.  Have blood in your urine. Summary  A urinary tract infection (UTI) is an infection of any part of the urinary tract, which includes the  kidneys, ureters, bladder, and urethra.  Most urinary tract infections are caused by bacteria in your genital area, around the entrance to your urinary tract (urethra).  You are more likely to develop a UTI during pregnancy.  If you were prescribed an antibiotic medicine, take it as told by your health care provider. Do not stop using the antibiotic even if you start to feel better. This information is not intended to  replace advice given to you by your health care provider. Make sure you discuss any questions you have with your health care provider. Document Released: 12/03/2010 Document Revised: 11/30/2018 Document Reviewed: 07/12/2018 Elsevier Patient Education  2020 [employer].

## 2019-08-05 LAB — GC/CHLAMYDIA PROBE AMP (~~LOC~~) NOT AT ARMC
Chlamydia: NEGATIVE
Comment: NEGATIVE
Comment: NORMAL
Neisseria Gonorrhea: NEGATIVE

## 2019-08-05 LAB — CULTURE, OB URINE

## 2019-08-06 LAB — CULTURE, OB URINE

## 2019-08-07 ENCOUNTER — Encounter: Payer: Self-pay | Admitting: Women's Health

## 2019-08-07 DIAGNOSIS — R8271 Bacteriuria: Secondary | ICD-10-CM | POA: Insufficient documentation

## 2019-08-23 NOTE — L&D Delivery Note (Signed)
Delivery Note Katie Jordan is a G2P1001 at [redacted]w[redacted]d who had a spontaneous delivery at 12:56pm on 01/14/20  a viable female "Vonna Kotyk" was delivered via OA.  APGAR: 8,9; weight 3115g (6lb13.9oz)  Admitted for IOL 2/2 GDMA2. On admit she was 1cm, received cytotec x2, then [place] and continued to contract on her own. Received epidural for pain management. Progressed normally. Pushed for 6 minutes. Baby was delivered without difficulty. No nuchal cord. Delayed cord clamping for 60 seconds. Delivery of placenta was spontaneous. Placenta was found to be intact, 3 -vessel cord was noted. The fundus was found to be firm.  1sr degree perineal laceration was repaired in the normal sterile fashion with 2-0 vicryl. Estimated blood loss 100cc. Instrument and gauze counts were correct at the end of the procedure.  Placenta status: L&D Mom to postpartum.  Baby to Couplet care / Skin to Skin.  [name] 01/14/2020, 1:07 PM

## 2019-10-07 ENCOUNTER — Encounter: Payer: Medicaid Other | Attending: Obstetrics & Gynecology | Admitting: Registered"

## 2019-10-07 ENCOUNTER — Other Ambulatory Visit: Payer: Self-pay

## 2019-10-07 DIAGNOSIS — O99810 Abnormal glucose complicating pregnancy: Secondary | ICD-10-CM | POA: Insufficient documentation

## 2019-10-07 NOTE — Patient Instructions (Signed)
Great job on increasing your activity, checking your blood sugar often and eating foods that have improved your blood sugar control.  For your continued elevated fasting numbers you can try a snack at night.  Watch for a 704 phone number from the Omn[employer] rep to help you learn about the potential option for insulin should you need to go that route.

## 2019-10-14 ENCOUNTER — Encounter: Payer: Self-pay | Admitting: Registered"

## 2019-10-14 DIAGNOSIS — O99810 Abnormal glucose complicating pregnancy: Secondary | ICD-10-CM | POA: Insufficient documentation

## 2019-10-14 NOTE — Progress Notes (Signed)
Diabetes Self-Management Education  Visit Type: Follow-up  Appt. Start Time: 1430 Appt. End Time: 1500  10/07/2019  [name], identified by name and date of birth, is a 39 y.o. female with a diagnosis of Diabetes: Gestational Diabetes.   ASSESSMENT  There were no vitals taken for this visit. There is no height or weight on file to calculate BMI.   Patient states since class she was staying around 50-65 grams cho in meals, but states she didn't do as well for a while due to increased stress in her life. Pt states that at a recent MD visit she was told she would need to go on insulin if her blood sugar numbers didn't come down.   Pt states she will do anything not to stick herself with a needle. Patient drastically changed what she was eating and started getting more exercise. Patient was able to bring down her daytime numbers but her FBS continues to be elevated. RD encouraged patient to eat adequate amount of food for pregnancy and to make sure she is also getting plenty of protein. Patient states she was given one more week to see what she can do.  RD reviewed Omnipod insulin delivery device with patient. Patient states this options is something she would want to consider since it doesn't involve giving herself daily injections. Patient has discussed the Omnipod with the representative who will provide her MD any information they need for potential use.  Diabetes Self-Management Education - 10/07/19 1343      Visit Information   Visit Type  Follow-up      Initial Visit   Diabetes Type  Gestational Diabetes    Are you currently following a meal plan?  Yes      Dietary Intake   Breakfast  egg, pecans    Lunch  green beans, pecans    Snack (afternoon)  grapes    Dinner  spaghetti squash, kale    Beverage(s)  water      Exercise   Exercise Type  Light (walking / raking leaves)    How many days per week to you exercise?  7      Outcomes   Expected Outcomes  Demonstrated  interest in learning. Expect positive outcomes    Future DMSE  PRN    Program Status  Not Completed      Subsequent Visit   Since your last visit have you continued or begun to take your medications as prescribed?  Yes       Individualized Plan for Diabetes Self-Management Training:   Learning Objective:  Patient will have a greater understanding of diabetes self-management. Patient education plan is to attend individual and/or group sessions per assessed needs and concerns.   Patient Instructions  Doristine Devoid job on increasing your activity, checking your blood sugar often and eating foods that have improved your blood sugar control.  For your continued elevated fasting numbers you can try a snack at night.  Watch for a 704 phone number from the [employer] rep to help you learn about the potential option for insulin should you need to go that route.    Expected Outcomes:  Demonstrated interest in learning. Expect positive outcomes  Education material provided: Omnipod information, snack sheet  If problems or questions, patient to contact team via:  Phone and Mychart  Future DSME appointment: PRN

## 2019-10-15 ENCOUNTER — Telehealth: Payer: Self-pay | Admitting: Registered"

## 2019-10-15 NOTE — Telephone Encounter (Signed)
Patient called with questions about her updated MD plan of care and how it might affect her diet plan. Pt states metformin was discontinued and she started glyburide and was given another week to try this strategy for her FBS.  Pt states she was instructed to check post prandial blood sugar 2 hrs after meals instead of 1. Pt reports her post prandial blood sugar has stayed within target range recently but her FBS stays 101-110 mg/dL. We discussed strategies to try. Reminded patient to eat balanced meals.

## 2020-01-06 ENCOUNTER — Other Ambulatory Visit: Payer: Self-pay | Admitting: Obstetrics and Gynecology

## 2020-01-07 ENCOUNTER — Telehealth (HOSPITAL_COMMUNITY): Payer: Self-pay | Admitting: *Deleted

## 2020-01-08 ENCOUNTER — Encounter (HOSPITAL_COMMUNITY): Payer: Self-pay | Admitting: *Deleted

## 2020-01-08 NOTE — Telephone Encounter (Signed)
Preadmission screen  

## 2020-01-09 ENCOUNTER — Telehealth (HOSPITAL_COMMUNITY): Payer: Self-pay | Admitting: *Deleted

## 2020-01-09 ENCOUNTER — Encounter (HOSPITAL_COMMUNITY): Payer: Self-pay | Admitting: *Deleted

## 2020-01-09 NOTE — Telephone Encounter (Signed)
Preadmission screen  

## 2020-01-11 ENCOUNTER — Other Ambulatory Visit (HOSPITAL_COMMUNITY)
Admission: RE | Admit: 2020-01-11 | Discharge: 2020-01-11 | Disposition: A | Discharge status: Home / Self Care | Payer: Medicaid Other | Source: Ambulatory Visit | Attending: Obstetrics and Gynecology | Admitting: Obstetrics and Gynecology

## 2020-01-11 DIAGNOSIS — Z20822 Contact with and (suspected) exposure to covid-19: Secondary | ICD-10-CM | POA: Insufficient documentation

## 2020-01-11 DIAGNOSIS — Z01812 Encounter for preprocedural laboratory examination: Secondary | ICD-10-CM | POA: Insufficient documentation

## 2020-01-11 LAB — SARS CORONAVIRUS 2 (TAT 6-24 HRS): SARS Coronavirus 2: NEGATIVE

## 2020-01-14 ENCOUNTER — Encounter (HOSPITAL_COMMUNITY): Payer: Self-pay | Admitting: Obstetrics and Gynecology

## 2020-01-14 ENCOUNTER — Inpatient Hospital Stay (HOSPITAL_COMMUNITY): Payer: Medicaid Other

## 2020-01-14 ENCOUNTER — Other Ambulatory Visit: Payer: Self-pay

## 2020-01-14 ENCOUNTER — Inpatient Hospital Stay (HOSPITAL_COMMUNITY): Payer: Medicaid Other | Admitting: Anesthesiology

## 2020-01-14 ENCOUNTER — Inpatient Hospital Stay (HOSPITAL_COMMUNITY)
Admission: AD | Admit: 2020-01-14 | Discharge: 2020-01-16 | DRG: 807 | Disposition: A | Discharge status: Home / Self Care | Payer: Medicaid Other | Attending: Obstetrics & Gynecology | Admitting: Obstetrics & Gynecology

## 2020-01-14 DIAGNOSIS — O24419 Gestational diabetes mellitus in pregnancy, unspecified control: Secondary | ICD-10-CM | POA: Diagnosis present

## 2020-01-14 DIAGNOSIS — O24425 Gestational diabetes mellitus in childbirth, controlled by oral hypoglycemic drugs: Secondary | ICD-10-CM | POA: Diagnosis present

## 2020-01-14 DIAGNOSIS — O3413 Maternal care for benign tumor of corpus uteri, third trimester: Secondary | ICD-10-CM | POA: Diagnosis present

## 2020-01-14 DIAGNOSIS — E669 Obesity, unspecified: Secondary | ICD-10-CM | POA: Diagnosis present

## 2020-01-14 DIAGNOSIS — O99214 Obesity complicating childbirth: Secondary | ICD-10-CM | POA: Diagnosis present

## 2020-01-14 DIAGNOSIS — D259 Leiomyoma of uterus, unspecified: Secondary | ICD-10-CM | POA: Diagnosis present

## 2020-01-14 DIAGNOSIS — O99824 Streptococcus B carrier state complicating childbirth: Secondary | ICD-10-CM | POA: Diagnosis present

## 2020-01-14 DIAGNOSIS — Z3A39 39 weeks gestation of pregnancy: Secondary | ICD-10-CM

## 2020-01-14 DIAGNOSIS — Z3490 Encounter for supervision of normal pregnancy, unspecified, unspecified trimester: Secondary | ICD-10-CM | POA: Diagnosis present

## 2020-01-14 LAB — GLUCOSE, CAPILLARY
Glucose-Capillary: 66 mg/dL — ABNORMAL LOW (ref 70–99)
Glucose-Capillary: 102 mg/dL — ABNORMAL HIGH (ref 70–99)
Glucose-Capillary: 87 mg/dL (ref 70–99)
Glucose-Capillary: 72 mg/dL (ref 70–99)

## 2020-01-14 LAB — ABO/RH: ABO/RH(D): AB POS

## 2020-01-14 LAB — TYPE AND SCREEN
ABO/RH(D): AB POS
Antibody Screen: NEGATIVE

## 2020-01-14 LAB — CBC
HCT: 34.2 % — ABNORMAL LOW (ref 36.0–46.0)
Hemoglobin: 10.8 g/dL — ABNORMAL LOW (ref 12.0–15.0)
MCH: 24.2 pg — ABNORMAL LOW (ref 26.0–34.0)
MCHC: 31.6 g/dL (ref 30.0–36.0)
MCV: 76.7 fL — ABNORMAL LOW (ref 80.0–100.0)
Platelets: 226 10*3/uL (ref 150–400)
RBC: 4.46 MIL/uL (ref 3.87–5.11)
RDW: 15.5 % (ref 11.5–15.5)
WBC: 11.2 10*3/uL — ABNORMAL HIGH (ref 4.0–10.5)
nRBC: 0 % (ref 0.0–0.2)

## 2020-01-14 LAB — RPR: RPR Ser Ql: NONREACTIVE

## 2020-01-14 MED ORDER — OXYCODONE HCL 5 MG PO TABS: 10.0000 mg | ORAL_TABLET | ORAL | Status: DC | PRN

## 2020-01-14 MED ORDER — ONDANSETRON HCL 4 MG/2ML IJ SOLN: 4.0000 mg | Freq: Four times a day (QID) | INTRAMUSCULAR | Status: DC | PRN

## 2020-01-14 MED ORDER — OXYTOCIN 40 UNITS IN NORMAL SALINE INFUSION - SIMPLE MED: 2.5000 [IU]/h | INTRAVENOUS | Status: DC

## 2020-01-14 MED ORDER — SODIUM CHLORIDE 0.9 % IV SOLN: 250.0000 mL | INTRAVENOUS | Status: DC | PRN

## 2020-01-14 MED ORDER — PHENYLEPHRINE 40 MCG/ML (10ML) SYRINGE FOR IV PUSH (FOR BLOOD PRESSURE SUPPORT): 80.0000 ug | PREFILLED_SYRINGE | INTRAVENOUS | Status: DC | PRN

## 2020-01-14 MED ORDER — BENZOCAINE-MENTHOL 20-0.5 % EX AERO
1.0000 "application " | INHALATION_SPRAY | CUTANEOUS | Status: DC | PRN
  Administered 2020-01-14: 1 via TOPICAL
  Filled 2020-01-14: qty 56

## 2020-01-14 MED ORDER — LACTATED RINGERS IV SOLN: INTRAVENOUS | Status: DC

## 2020-01-14 MED ORDER — FENTANYL-BUPIVACAINE-NACL 0.5-0.125-0.9 MG/250ML-% EP SOLN
12.0000 mL/h | EPIDURAL | Status: DC | PRN
  Filled 2020-01-14: qty 250

## 2020-01-14 MED ORDER — PENICILLIN G POT IN DEXTROSE 60000 UNIT/ML IV SOLN
3.0000 10*6.[IU] | INTRAVENOUS | Status: DC
  Administered 2020-01-14 (×2): 3 10*6.[IU] via INTRAVENOUS
  Filled 2020-01-14 (×2): qty 50

## 2020-01-14 MED ORDER — SODIUM CHLORIDE 0.9 % IV SOLN
5.0000 10*6.[IU] | Freq: Once | INTRAVENOUS | Status: AC
  Administered 2020-01-14: 5 10*6.[IU] via INTRAVENOUS
  Filled 2020-01-14: qty 5

## 2020-01-14 MED ORDER — LIDOCAINE HCL (PF) 1 % IJ SOLN
INTRAMUSCULAR | Status: DC | PRN
  Administered 2020-01-14: 10 mL via EPIDURAL
  Administered 2020-01-14: 2 mL via EPIDURAL

## 2020-01-14 MED ORDER — DIPHENHYDRAMINE HCL 25 MG PO CAPS: 25.0000 mg | ORAL_CAPSULE | Freq: Four times a day (QID) | ORAL | Status: DC | PRN

## 2020-01-14 MED ORDER — EPHEDRINE 5 MG/ML INJ: 10.0000 mg | INTRAVENOUS | Status: DC | PRN

## 2020-01-14 MED ORDER — LACTATED RINGERS IV SOLN
500.0000 mL | INTRAVENOUS | Status: DC | PRN
  Administered 2020-01-14: 500 mL via INTRAVENOUS
  Administered 2020-01-14: 1000 mL via INTRAVENOUS

## 2020-01-14 MED ORDER — OXYCODONE HCL 5 MG PO TABS: 5.0000 mg | ORAL_TABLET | ORAL | Status: DC | PRN

## 2020-01-14 MED ORDER — TERBUTALINE SULFATE 1 MG/ML IJ SOLN: 0.2500 mg | Freq: Once | INTRAMUSCULAR | Status: DC | PRN

## 2020-01-14 MED ORDER — IBUPROFEN 600 MG PO TABS
600.0000 mg | ORAL_TABLET | Freq: Four times a day (QID) | ORAL | Status: DC
  Administered 2020-01-14 – 2020-01-16 (×7): 600 mg via ORAL
  Filled 2020-01-14 (×7): qty 1

## 2020-01-14 MED ORDER — LACTATED RINGERS IV SOLN
500.0000 mL | Freq: Once | INTRAVENOUS | Status: AC
  Administered 2020-01-14: 500 mL via INTRAVENOUS

## 2020-01-14 MED ORDER — SODIUM CHLORIDE 0.9% FLUSH: 3.0000 mL | Freq: Two times a day (BID) | INTRAVENOUS | Status: DC

## 2020-01-14 MED ORDER — ACETAMINOPHEN 325 MG PO TABS: 650.0000 mg | ORAL_TABLET | ORAL | Status: DC | PRN

## 2020-01-14 MED ORDER — ONDANSETRON HCL 4 MG/2ML IJ SOLN: 4.0000 mg | INTRAMUSCULAR | Status: DC | PRN

## 2020-01-14 MED ORDER — PRENATAL MULTIVITAMIN CH
1.0000 | ORAL_TABLET | Freq: Every day | ORAL | Status: DC
  Administered 2020-01-15: 1 via ORAL
  Filled 2020-01-14: qty 1

## 2020-01-14 MED ORDER — SODIUM CHLORIDE (PF) 0.9 % IJ SOLN
INTRAMUSCULAR | Status: DC | PRN
  Administered 2020-01-14: 12 mL/h via EPIDURAL

## 2020-01-14 MED ORDER — LIDOCAINE HCL (PF) 1 % IJ SOLN: 30.0000 mL | INTRAMUSCULAR | Status: DC | PRN

## 2020-01-14 MED ORDER — ONDANSETRON HCL 4 MG PO TABS: 4.0000 mg | ORAL_TABLET | ORAL | Status: DC | PRN

## 2020-01-14 MED ORDER — SOD CITRATE-CITRIC ACID 500-334 MG/5ML PO SOLN: 30.0000 mL | ORAL | Status: DC | PRN

## 2020-01-14 MED ORDER — SIMETHICONE 80 MG PO CHEW: 80.0000 mg | CHEWABLE_TABLET | ORAL | Status: DC | PRN

## 2020-01-14 MED ORDER — SODIUM CHLORIDE 0.9% FLUSH: 3.0000 mL | INTRAVENOUS | Status: DC | PRN

## 2020-01-14 MED ORDER — DIBUCAINE (PERIANAL) 1 % EX OINT: 1.0000 "application " | TOPICAL_OINTMENT | CUTANEOUS | Status: DC | PRN

## 2020-01-14 MED ORDER — OXYTOCIN BOLUS FROM INFUSION
500.0000 mL | Freq: Once | INTRAVENOUS | Status: AC
  Administered 2020-01-14: 500 mL via INTRAVENOUS

## 2020-01-14 MED ORDER — MISOPROSTOL 25 MCG QUARTER TABLET
25.0000 ug | ORAL_TABLET | ORAL | Status: DC | PRN
  Administered 2020-01-14 (×2): 25 ug via VAGINAL
  Filled 2020-01-14 (×2): qty 1

## 2020-01-14 MED ORDER — COCONUT OIL OIL: 1.0000 "application " | TOPICAL_OIL | Status: DC | PRN

## 2020-01-14 MED ORDER — TETANUS-DIPHTH-ACELL PERTUSSIS 5-2.5-18.5 LF-MCG/0.5 IM SUSP: 0.5000 mL | Freq: Once | INTRAMUSCULAR | Status: DC

## 2020-01-14 MED ORDER — DOCUSATE SODIUM 100 MG PO CAPS
100.0000 mg | ORAL_CAPSULE | Freq: Two times a day (BID) | ORAL | Status: DC
  Administered 2020-01-15 – 2020-01-16 (×4): 100 mg via ORAL
  Filled 2020-01-14 (×4): qty 1

## 2020-01-14 MED ORDER — DIPHENHYDRAMINE HCL 50 MG/ML IJ SOLN: 12.5000 mg | INTRAMUSCULAR | Status: DC | PRN

## 2020-01-14 MED ORDER — FENTANYL CITRATE (PF) 100 MCG/2ML IJ SOLN
50.0000 ug | INTRAMUSCULAR | Status: DC | PRN
  Administered 2020-01-14: 100 ug via INTRAVENOUS
  Filled 2020-01-14: qty 2

## 2020-01-14 MED ORDER — WITCH HAZEL-GLYCERIN EX PADS: 1.0000 "application " | MEDICATED_PAD | CUTANEOUS | Status: DC | PRN

## 2020-01-14 MED ORDER — OXYTOCIN 40 UNITS IN NORMAL SALINE INFUSION - SIMPLE MED
1.0000 m[IU]/min | INTRAVENOUS | Status: DC
  Filled 2020-01-14: qty 1000

## 2020-01-14 NOTE — Anesthesia Preprocedure Evaluation (Signed)
Anesthesia Evaluation  Patient identified by MRN, date of birth, ID band Patient awake    Reviewed: Allergy & Precautions, Patient's Chart, lab work & pertinent test results  Airway Mallampati: II  TM Distance: >3 FB Neck ROM: Full    Dental no notable dental hx.    Pulmonary neg pulmonary ROS,    Pulmonary exam normal breath sounds clear to auscultation       Cardiovascular negative cardio ROS Normal cardiovascular exam Rhythm:Regular Rate:Normal     Neuro/Psych negative neurological ROS  negative psych ROS   GI/Hepatic negative GI ROS, Neg liver ROS,   Endo/Other  diabetes, Gestational, Oral Hypoglycemic AgentsBMI 37  Renal/GU negative Renal ROS  Female GU complaint Uterine fibroids    Musculoskeletal negative musculoskeletal ROS (+)   Abdominal   Peds  Hematology  (+) Blood dyscrasia, anemia , hct 34.2, plt 226   Anesthesia Other Findings   Reproductive/Obstetrics (+) Pregnancy                             Anesthesia Physical Anesthesia Plan  ASA: III and emergent  Anesthesia Plan: Epidural   Post-op Pain Management:    Induction:   PONV Risk Score and Plan: 2  Airway Management Planned: Natural Airway  Additional Equipment: None  Intra-op Plan:   Post-operative Plan:   Informed Consent: I have reviewed the patients History and Physical, chart, labs and discussed the procedure including the risks, benefits and alternatives for the proposed anesthesia with the patient or authorized representative who has indicated his/her understanding and acceptance.       Plan Discussed with:   Anesthesia Plan Comments:         Anesthesia Quick Evaluation

## 2020-01-14 NOTE — H&P (Signed)
Katie Jordan is a 39 y.o. female G2P1001 [redacted]w[redacted]d presenting for IOL 2/2 GDMA2. She reports no LOF, VB, contractions. Normal FM.   Pregnancy c/b: 1. 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 A1c 6.8% at 12w, 1h 229 at 14w. Initially on Metformin 500mg  BID > 500mg  AM / 1000mg  QHS at 20w > glyburide 2.5/5 at 26w> 7.75 BID at 29w. Last growth 38w 5/19, 7#14, 74%ile 2. AMA: panorama low risk 3. Obesity: BMI 37 4. Trich: + at new OB 07/15/2019. TOC Neg 1/4 5. Fibroids: 2 fibroids posterior right subserosal (5.78x5.08x5.41cm) and posterior left subserosal (8.11x6.87x7.29cm). 6. Poly on [place] at 27w, resolved on [place] at 38w   OB History    Gravida  2   Para  1   Term  1   Preterm      AB      Living  1     SAB      TAB      Ectopic      Multiple      Live Births  1          Past Medical History:  Diagnosis Date  . Fibroid   . Fibroids   . GDM (gestational diabetes mellitus) 06/2019  . Gestational diabetes   . Trichomonas contact 06/2019   Treated    Past Surgical History:  Procedure Laterality Date  . NO PAST SURGERIES     Family History: family history includes Diabetes in her mother. Social History:  reports that she has never smoked. She has never used smokeless tobacco. She reports that she does not drink alcohol or use drugs.     Maternal Diabetes: Yes:  Diabetes Type:  Insulin/Medication controlled Genetic Screening: Normal Maternal Ultrasounds/Referrals: Normal Fetal Ultrasounds or other Referrals:  None Maternal Substance Abuse:  No Significant Maternal Medications: none Significant Maternal Lab Results:  Group B Strep positive Other Comments:  None  Review of Systems Per HPI Exam Physical Exam  Dilation: 1.5 Effacement (%): 40 Station: -3 Exam by:: Cassie Freer, RN  Vitals:   01/14/20 0601 01/14/20 0740 01/14/20 0758 01/14/20 0802  BP: 108/74 (!) 98/57  (!) 99/52  Pulse: 87 86  92  Resp:    18  Temp:   98.5 F (36.9 C)   TempSrc:   Oral    Weight:      Height:       NAD, resting comfortably Gravid abdomen Fetal testing: FHR 135, +moderate variability, +accels, now having occasional late decels, previously Cat 1 Prenatal labs: ABO, Rh:  --/--/AB POS, AB POS Performed at [facility] [address], [place], [place] [address]  [phone]) Antibody: NEG (05/25 0035) Rubella: Immune (11/23 0000) RPR: Nonreactive (11/23 0000)  HBsAg: Negative (11/23 0000)  HIV: Non-reactive (11/23 0000)  GBS: Positive/-- (12/13 0000)   Assessment/Plan: Timmothy Euler 39 y.o. G2P1001 at [redacted]w[redacted]d here for IOL 2/2 GDMA2 1. IOL: On admit 1cm, received cytotec x2, plan for one more cytotec for ripening prn and then pitocin/AROM -Fetal monitoring overall reassuring but with late decels currently, will give IVF bolus 2. GDMA2: monitor BG q4h 3. AMA: panorama low risk 4. Obesity: BMI 37 5. Trich: + at new OB 07/15/2019. TOC Neg 1/4 6. Fibroids: 2 fibroids posterior right subserosal (5.78x5.08x5.41cm) and posterior left subserosal (8.11x6.87x7.29cm). 7. Poly on [place] at 27w, resolved on [place] at [place] 01/14/2020, 7:51 AM

## 2020-01-14 NOTE — Progress Notes (Signed)
Patient CBG level 66. Gave patient juice. Will reassess and recheck in 30 minutes.

## 2020-01-14 NOTE — Anesthesia Procedure Notes (Signed)
Epidural Patient location during procedure: OB Start time: 01/14/2020 10:24 AM End time: 01/14/2020 10:36 AM  Staffing Anesthesiologist: Pervis Hocking, DO Performed: anesthesiologist   Preanesthetic Checklist Completed: patient identified, IV checked, risks and benefits discussed, monitors and equipment checked, pre-op evaluation and timeout performed  Epidural Patient position: sitting Prep: DuraPrep and site prepped and draped Patient monitoring: continuous pulse ox, blood pressure, heart rate and cardiac monitor Approach: midline Location: L3-L4 Injection technique: LOR air  Needle:  Needle type: Tuohy  Needle gauge: 17 G Needle length: 9 cm Needle insertion depth: 9 cm Catheter type: closed end flexible Catheter size: 19 Gauge Catheter at skin depth: 15 cm Test dose: negative  Assessment Sensory level: T8 Events: blood not aspirated, injection not painful, no injection resistance, no paresthesia and negative IV test  Additional Notes Patient identified. Risks/Benefits/Options discussed with patient including but not limited to bleeding, infection, nerve damage, paralysis, failed block, incomplete pain control, headache, blood pressure changes, nausea, vomiting, reactions to medication both or allergic, itching and postpartum back pain. Confirmed with bedside nurse the patient's most recent platelet count. Confirmed with patient that they are not currently taking any anticoagulation, have any bleeding history or any family history of bleeding disorders. Patient expressed understanding and wished to proceed. All questions were answered. Sterile technique was used throughout the entire procedure. Please see nursing notes for vital signs. Test dose was given through epidural catheter and negative prior to continuing to dose epidural or start infusion. Warning signs of high block given to the patient including shortness of breath, tingling/numbness in hands, complete motor  block, or any concerning symptoms with instructions to call for help. Patient was given instructions on fall risk and not to get out of bed. All questions and concerns addressed with instructions to call with any issues or inadequate analgesia.  Reason for block:procedure for pain

## 2020-01-14 NOTE — Anesthesia Postprocedure Evaluation (Signed)
Anesthesia Post Note  Patient: Katie Jordan  Procedure(s) Performed: AN AD [place]     Patient location during evaluation: Mother Baby Anesthesia Type: Epidural Level of consciousness: awake and alert Pain management: pain level controlled Vital Signs Assessment: post-procedure vital signs reviewed and stable Respiratory status: spontaneous breathing Cardiovascular status: stable Postop Assessment: no apparent nausea or vomiting, able to ambulate and patient able to bend at knees Anesthetic complications: no    Last Vitals:  Vitals:   01/14/20 1438 01/14/20 1538  BP: 117/71 113/62  Pulse: 76 70  Resp: 16 16  Temp: 37.1 C 37.4 C  SpO2: 100% 100%    Last Pain:  Vitals:   01/14/20 1731  TempSrc:   PainSc: 0-No pain   Pain Goal:                   Everette Rank

## 2020-01-14 NOTE — Progress Notes (Signed)
Requested to verify fetal position. VERTEX by BSUS.  Barrington Ellison, MD [facility] Fellow, [facility] for [employer], [place]

## 2020-01-14 NOTE — Progress Notes (Signed)
Dr. Ouida Sills contacted regarding induction of patient for uncontrolled GDM.  Request for CBG orders Q4H, MD declined. No new orders received.   Marcene Duos, RN 01/14/20 [phone]

## 2020-01-14 NOTE — Progress Notes (Signed)
Inpatient Diabetes Program Recommendations  Diabetes Treatment Program Recommendations  ADA Standards of Care 2018 Diabetes in Pregnancy Target Glucose Ranges:  Fasting: 60 - 90 mg/dL Preprandial: 60 - 105 mg/dL 1 hr postprandial: Less than 140mg /dL (from first bite of meal) 2 hr postprandial: Less than 120 mg/dL (from first bite of meal)    Lab Results  Component Value Date   GLUCAP 87 01/14/2020    Review of Glycemic Control Results for SELENIA, NOONE (MRN [id number]) as of 01/14/2020 09:32  Ref. Range 01/14/2020 03:24 01/14/2020 04:11 01/14/2020 08:58  Glucose-Capillary Latest Ref Range: 70 - 99 mg/dL 66 (L) 102 (H) 87   Diabetes history: GDM Outpatient Diabetes medications: Glyburide 7.5 mg BID Current orders for Inpatient glycemic control: none  Inpatient Diabetes Program Recommendations:    Noted consult for GDM. CBGs Q4H added; in agreement.  If 2 consecutive CBGs >120 mg/dL, consider adding IV insulin.  Following.   Thanks, Bronson Curb, MSN, RNC-OB Diabetes Coordinator [phone] (8a-5p)

## 2020-01-15 LAB — CBC
HCT: 29.9 % — ABNORMAL LOW (ref 36.0–46.0)
Hemoglobin: 9.1 g/dL — ABNORMAL LOW (ref 12.0–15.0)
MCH: 23.9 pg — ABNORMAL LOW (ref 26.0–34.0)
MCHC: 30.4 g/dL (ref 30.0–36.0)
MCV: 78.7 fL — ABNORMAL LOW (ref 80.0–100.0)
Platelets: 201 10*3/uL (ref 150–400)
RBC: 3.8 MIL/uL — ABNORMAL LOW (ref 3.87–5.11)
RDW: 15.6 % — ABNORMAL HIGH (ref 11.5–15.5)
WBC: 12 10*3/uL — ABNORMAL HIGH (ref 4.0–10.5)
nRBC: 0 % (ref 0.0–0.2)

## 2020-01-15 LAB — GLUCOSE, CAPILLARY: Glucose-Capillary: 108 mg/dL — ABNORMAL HIGH (ref 70–99)

## 2020-01-15 NOTE — Progress Notes (Signed)
Post Partum Day 1 Subjective: no complaints, up ad lib, voiding and tolerating PO  Objective: Blood pressure 107/73, pulse 80, temperature 98.2 F (36.8 C), temperature source Oral, resp. rate 19, height 5\' 2"  (1.575 m), weight 92.7 kg, SpO2 100 %, unknown if currently breastfeeding.  Physical Exam:  General: alert, cooperative and appears stated age Lochia: appropriate Uterine Fundus: firm DVT Evaluation: No evidence of DVT seen on physical exam.  Recent Labs    01/14/20 0035 01/15/20 0510  HGB 10.8* 9.1*  HCT 34.2* 29.9*    Assessment/Plan: Plan for discharge tomorrow Desires neonatal circumcision, R/B/A of procedure discussed at length. Pt understands that neonatal circumcision is not considered medically necessary and is elective. The risks include, but are not limited to bleeding, infection, damage to the penis, development of scar tissue, and having to have it redone at a later date. Pt understands theses risks and wishes to proceed   LOS: 1 day   Vanessa Kick 01/15/2020, 10:32 AM

## 2020-01-16 NOTE — Discharge Summary (Signed)
Postpartum Discharge Summary  Date of Service updated     Patient Name: Katie Jordan DOB: Oct 17, 1980 MRN: [id number]  Date of admission: 01/14/2020 Delivery date:01/14/2020  Delivering provider: Lyda Kalata K  Date of discharge: 01/16/2020  Admitting diagnosis: GDM (gestational diabetes mellitus) [O24.419] Encounter for induction of labor [Z34.90] Intrauterine pregnancy: [redacted]w[redacted]d    Secondary diagnosis:  Active Problems:   GDM (gestational diabetes mellitus)   Encounter for induction of labor  Additional problems: fibroids    Discharge diagnosis: Term Pregnancy Delivered                                            Post partum procedures:none Augmentation: Pitocin and Cytotec Complications: None  Hospital course: Induction of Labor With Vaginal Delivery   39y.o. yo G2P2002 at 363w0das admitted to the hospital 01/14/2020 for induction of labor.  Indication for induction: A2 DM.  Patient had an uncomplicated labor course as follows: Membrane Rupture Time/Date: 9:20 AM ,01/14/2020   Delivery Method:Vaginal, Spontaneous  Episiotomy: None  Lacerations:  1st degree  Details of delivery can be found in separate delivery note.  Patient had a routine postpartum course. Patient is discharged home 01/16/20.  Newborn Data: Birth date:01/14/2020  Birth time:12:56 PM  Gender:Female  Living status:Living  Apgars:8 ,9  Weight:3115 g   Magnesium Sulfate received: No BMZ received: No Rhophylac:No MMR:No T-DaP:Given prenatally Flu: No Transfusion:No  Physical exam  Vitals:   01/15/20 0519 01/15/20 1520 01/15/20 2339 01/16/20 0637  BP: 107/73 104/69 111/71 106/74  Pulse: 80 72 79 75  Resp: _0 Temp: 98.2 F (36.8 C) 98.2 F (36.8 C) 98 F (36.7 C) 98.3 F (36.8 C)  TempSrc: Oral Oral Oral Oral  SpO2: 100%     Weight:      Height:        Labs: Lab Results  Component Value Date   WBC 12.0 (H) 01/15/2020   HGB 9.1 (L) 01/15/2020   HCT 29.9 (L) 01/15/2020    MCV 78.7 (L) 01/15/2020   PLT 201 01/15/2020   CMP Latest Ref Rng & Units 08/17/2018  Glucose 70 - 99 mg/dL 93  BUN 6 - 20 mg/dL 7  Creatinine 0.44 - 1.00 mg/dL 1.01(H)  Sodium 135 - 145 mmol/L 140  Potassium 3.5 - 5.1 mmol/L 3.6  Chloride 98 - 111 mmol/L 108  CO2 22 - 32 mmol/L 23  Calcium 8.9 - 10.3 mg/dL 9.2  Total Protein 6.5 - 8.1 g/dL 7.9  Total Bilirubin 0.3 - 1.2 mg/dL 0.6  Alkaline Phos 38 - 126 U/L 71  AST 15 - 41 U/L 20  ALT 0 - 44 U/L 25   Edinburgh Score: Edinburgh Postnatal Depression Scale Screening Tool 01/15/2020  I have been able to laugh and see the funny side of things. 0  I have looked forward with enjoyment to things. 1  I have blamed myself unnecessarily when things went wrong. 2  I have been anxious or worried for no good reason. 0  I have felt scared or panicky for no good reason. 1  Things have been getting on top of me. 2  I have been so unhappy that I have had difficulty sleeping. 1  I have felt sad or miserable. 1  I have been so unhappy that I have been crying. 1  The thought of  harming myself has occurred to me. 0  Edinburgh Postnatal Depression Scale Total 9      After visit meds:  Allergies as of 01/16/2020   No Known Allergies     Medication List    STOP taking these medications   aspirin 81 MG chewable tablet   cephALEXin 500 MG capsule Commonly known as: KEFLEX   glyBURIDE 2.5 MG tablet Commonly known as: DIABETA   terconazole 0.8 % vaginal cream Commonly known as: TERAZOL 3     TAKE these medications   acetaminophen 325 MG tablet Commonly known as: Tylenol Take 2 tablets (650 mg total) by mouth every 4 (four) hours as needed.   cyclobenzaprine 10 MG tablet Commonly known as: FLEXERIL Take 1 tablet (10 mg total) by mouth 3 (three) times daily as needed for muscle spasms.            Discharge Care Instructions  (From admission, onward)         Start     Ordered   01/16/20 0000  Discharge wound care:     Comments: Sitz baths and icepacks to perineum.  If stitches, they will dissolve.   01/16/20 0715           Discharge home in stable condition Infant Feeding:  Infant Disposition:home with mother Discharge instruction: per After Visit Summary and Postpartum booklet. Activity: Advance as tolerated. Pelvic rest for 6 weeks.  Diet: carb modified diet Anticipated Birth Control: Unsure Postpartum Appointment:4 weeks Additional Postpartum F/U: none Future Appointments:No future appointments. Follow up Visit: Follow-up Information    Taam-Akelman, Lawrence Santiago, MD Follow up in 4 week(s).   Specialty: Obstetrics and Gynecology Contact information: [address] [place] [address] [phone]               01/16/2020 Daria Pastures, MD

## 2021-07-21 ENCOUNTER — Other Ambulatory Visit: Payer: Self-pay | Admitting: Family Medicine

## 2021-07-21 DIAGNOSIS — R1031 Right lower quadrant pain: Secondary | ICD-10-CM

## 2021-08-18 ENCOUNTER — Other Ambulatory Visit: Payer: Medicaid Other

## 2021-10-05 ENCOUNTER — Ambulatory Visit: Payer: Medicaid Other | Admitting: Registered"

## 2021-11-23 ENCOUNTER — Ambulatory Visit: Payer: Medicaid Other | Admitting: Dietician

## 2022-02-01 ENCOUNTER — Ambulatory Visit: Payer: Medicaid Other | Admitting: Dietician

## 2022-09-19 ENCOUNTER — Encounter: Payer: Self-pay | Admitting: Registered"

## 2022-09-19 ENCOUNTER — Encounter: Payer: Medicaid Other | Attending: Obstetrics and Gynecology | Admitting: Registered"

## 2022-09-19 VITALS — Ht 65.0 in | Wt 203.1 lb

## 2022-09-19 DIAGNOSIS — E119 Type 2 diabetes mellitus without complications: Secondary | ICD-10-CM | POA: Insufficient documentation

## 2022-09-19 NOTE — Patient Instructions (Addendum)
Continue your physical activity including resistance.   Continue planning food options during sports seasons  Carb counting 30-45 g per meal / 15 g snack Balance with lean protein and non-starchy vegetables Review the shopping list to meet MyPlate guidelines  Small servings sizes of beverage that contain natural or added sugar.  Checking your blood sugar (your goal is normal blood sugar) Fasting: 80-100 mg/dL / 2 hours after eating less than 160 mg/dL

## 2022-09-19 NOTE — Progress Notes (Unsigned)
Diabetes Self-Management Education  Visit Type: First/Initial  Appt. Start Time: 1400 Appt. End Time: 5830  09/20/2022  Katie Jordan, identified by name and date of birth, is a 42 y.o. female with a diagnosis of Diabetes: Type 2.   ASSESSMENT  Height 5' 5"  (1.651 m), weight 203 lb 1.6 oz (92.1 kg), unknown if currently breastfeeding. Body mass index is 33.8 kg/m.  A1c 7.2% SMBG: Pt reports FBS 119 mg/dL, PPBG: 148 after breakfast No medication  Pt states she is highly motivated to start taking better care of herself and states last time she was in for diabetes education it was very helpful. Pt states she has had a lot of stress in the last year with deaths of people close to her. Pt states she realizes the importance of self-care and wants to be around a long time for her children.  Pt states she worked hard to avoid using insulin in pregnancy. Pt reports couldn't remember what the goals are for carb intake and today wants a refresher for balanced eating and appropriate carb intake.  Pt states she was under a lot of stress after her baby was born, and feeling the financial strain of being a single parent, not getting support from her youngest son's father. Pt states she has been connected with [employer] to help with concerns about food lasting through the end of the month and needs to set up an appointment with them.  Pt states she eats dinner ~7-7:30 pm. Pt states when her older son is involved in sports it can make it more challenging to keep structure around meal times.    Diabetes Self-Management Education - 09/19/22 1352       Visit Information   Visit Type First/Initial      Initial Visit   Diabetes Type Type 2    Date Diagnosed Nov 2022    Are you currently following a meal plan? Yes   low carb     Health Coping   How would you rate your overall health? Good      Psychosocial Assessment   Patient Belief/Attitude about Diabetes Motivated to manage  diabetes    What is the hardest part about your diabetes right now, causing you the most concern, or is the most worrisome to you about your diabetes?   Other (comment);Making healty food and beverage choices    How often do you need to have someone help you when you read instructions, pamphlets, or other written materials from your doctor or pharmacy? 1 - Never    What is the last grade level you completed in school? masters degree      Complications   Last HgB A1C per patient/outside source 7.2 %    Have you had a dilated eye exam in the past 12 months? Yes    Have you had a dental exam in the past 12 months? No    Are you checking your feet? Yes    How many days per week are you checking your feet? 3      Dietary Intake   Breakfast [place] sausage, 2 eggs, toast, cranberry juice apple 27 g carb    Snack (morning) none    Lunch sandwich, pineapple, water    Snack (afternoon) smart food popcorn    Dinner bbq meatball, 1 c mac & cheese, green beans, potatoes, [place] toast, cranberry juicer    Snack (evening) none    Beverage(s) water, cranberry juice, low sweet tea 3-4x/month, coke zero,  rare alcohol      Activity / Exercise   Activity / Exercise Type Light (walking / raking leaves)    How many days per week do you exercise? 3    How many minutes per day do you exercise? 45    Total minutes per week of exercise 135      Patient Education   Previous Diabetes Education Yes (please comment)   2021     Individualized Goals (developed by patient)   Nutrition Follow meal plan discussed    Physical Activity Exercise 3-5 times per week    Monitoring  Test my blood glucose as discussed      Outcomes   Expected Outcomes Demonstrated interest in learning. Expect positive outcomes    Future DMSE PRN    Program Status Completed            Individualized Plan for Diabetes Self-Management Training:   Learning Objective:  Patient will have a greater understanding of diabetes  self-management. Patient education plan is to attend individual and/or group sessions per assessed needs and concerns.  Patient Instructions  Continue your physical activity including resistance.   Continue planning food options during sports seasons  Carb counting 30-45 g per meal / 15 g snack Balance with lean protein and non-starchy vegetables Review the shopping list to meet MyPlate guidelines  Small servings sizes of beverage that contain natural or added sugar.  Checking your blood sugar (your goal is normal blood sugar) Fasting: 80-100 mg/dL / 2 hours after eating less than 160 mg/dL  Expected Outcomes:  Demonstrated interest in learning. Expect positive outcomes  Education material provided: Education officer, museum, Planning Healthy Meals (carb counting book), MyPlate Shopping list template  If problems or questions, patient to contact team via:  Phone and MyChart  Future DSME appointment: PRN

## 2022-11-11 ENCOUNTER — Encounter: Payer: Self-pay | Admitting: Registered"

## 2022-11-11 ENCOUNTER — Encounter: Payer: Medicaid Other | Attending: Obstetrics and Gynecology | Admitting: Registered"

## 2022-11-11 VITALS — Wt 199.1 lb

## 2022-11-11 DIAGNOSIS — E119 Type 2 diabetes mellitus without complications: Secondary | ICD-10-CM | POA: Insufficient documentation

## 2022-11-11 NOTE — Patient Instructions (Addendum)
Check blood sugar 2 times per day Work on dinner, keep a food log .  Meal plan dinner a few days in advance. Aim to eat by 7 pm (mindfully) Zucchini noodles & spaghetti squash  Increase water intake to 8 glasses per day. Look into water filter for tap.  Use treadmill at least  5 times per week during the day.  Continue reading inspirational work at night.

## 2022-11-11 NOTE — Progress Notes (Signed)
Diabetes Self-Management Education  Visit Type:  follow-up  Appt. Start Time: 1125 Appt. End Time: 1205  11/11/2022  [name], identified by name and date of birth, is a 42 y.o. female with a diagnosis of Diabetes:  . T2D  ASSESSMENT  Weight 199 lb 1.6 oz (90.3 kg), unknown if currently breastfeeding. Body mass index is 33.13 kg/m.  Wt Readings from Last 3 Encounters:  11/11/22 199 lb 1.6 oz (90.3 kg)  09/20/22 203 lb 1.6 oz (92.1 kg)  01/14/20 204 lb 4.8 oz (92.7 kg)   Pt reports at last doctors appointment 196 lb  A1c 7.2% SMBG: FBS avg 127 mg/dL, PPBG: 121 mg/dL No medication  Pt states TG were a little high but not starting meds yet  Pt states has been very busy Feb & March due to son's activities, but not stressful. Pt reports that she needs to exercise at least 5x/week to reach her goals, lately has been closer to 3-4 x/week.   Pt states she tries to make better choices for carbs such as whole wheat instead of honey wheat, 1 waffle instead of 4. Pt states her PCP told her she needs to drink more milk & eat more fruit Pt reports sometimes will have Simple Choice frozen meals which help with portion control Or hot dog & fruit OR baked chicken & broccoli.  24-hr dietary recall: B: [place] sausage, 2 waffles, grits, 1/2 c diet pepsi S; none L: pork beans and hot dog S: none D: 1 piece bbq chicken, broccoli, roasted potatoes (was exhuasted at in bed 9:45 pm) Pt states she usually doesn't eat after 7 pm   Individualized Plan for Diabetes Self-Management Training:   Learning Objective:  Patient will have a greater understanding of diabetes self-management. Patient education plan is to attend individual and/or group sessions per assessed needs and concerns.  Patient Instructions  Check blood sugar 2 times per day Work on dinner, keep a food log .  Meal plan dinner a few days in advance. Aim to eat by 7 pm (mindfully) Zucchini noodles & spaghetti  squash  Increase water intake to 8 glasses per day. Look into water filter for tap.  Use treadmill at least  5 times per week during the day.  Continue reading inspirational work at night.  Expected Outcomes:   demonstrated interest in learning, expect positive change  Education material provided: none  If problems or questions, patient to contact team via:  Phone and MyChart  Future DSME appointment:  e months

## 2022-11-21 ENCOUNTER — Ambulatory Visit: Payer: Medicaid Other | Admitting: Registered"

## 2023-02-06 ENCOUNTER — Encounter: Payer: Medicaid Other | Attending: Nurse Practitioner | Admitting: Registered"

## 2023-02-06 ENCOUNTER — Encounter: Payer: Self-pay | Admitting: Registered"

## 2023-02-06 VITALS — Ht <= 58 in | Wt 192.1 lb

## 2023-02-06 DIAGNOSIS — E119 Type 2 diabetes mellitus without complications: Secondary | ICD-10-CM | POA: Diagnosis present

## 2023-02-06 NOTE — Patient Instructions (Addendum)
Meal prep in the evening so you can have a good balanced breakfast. Consider drinking more water, get back to making infused water  To help with fasting blood sugar 10 min exercise in the evening, continue having dinner by 7:30 pm Having a 10 min bedtime routine to help you calm your mind before sleep.  Great job on staying focused on making healthy food choices and having vegetables daily!  Great job on lowering your triglycerides!

## 2023-02-06 NOTE — Progress Notes (Signed)
Diabetes Self-Management Education  Visit Type:  follow-up  Appt. Start Time: 1000 Appt. End Time: 1040  02/06/2023  [name], identified by name and date of birth, is a 42 y.o. female with a diagnosis of Diabetes:  . T2D  ASSESSMENT  Weight 199 lb 1.6 oz (90.3 kg), unknown if currently breastfeeding. Body mass index is 33.13 kg/m.  Wt Readings from Last 3 Encounters:  02/06/23 192 lb 1.6 oz (87.1 kg)  11/11/22 199 lb 1.6 oz (90.3 kg)  09/20/22 203 lb 1.6 oz (92.1 kg)    Body Composition Scale Date 02/06/23  Current Body Weight 192.1 lb  Total Body Fat % 43.2%  Visceral Fat 14 (H)  Fat-Free Mass % 56.7%   Total Body Water % 42.8%  Muscle-Mass lbs 26.3 lb  BMI 41.5  Body Fat Displacement          Torso  lbs 51.4         Left Leg  lbs 10.2         Right Leg  lbs 10.2         Left Arm  lbs 5.1         Right Arm   lbs 5.1    We will check visceral fat at next visit.  Labs: A1c 7.2% (unchanged from last visit), TG 114 (improved) SMBG: FBS avg 125-130 mg/dL, dinner PPBG: 161 mg/dL Medication: Vitamin D Rx 2x/week, womens multivitamin   Physical activity: walking more with her boys to the bus stop.  Stress: Pt states she continues to work with counselor. Pt reports sleep not good with financial concerns increased costs in June and scheduling demands with her boys accepted to different summer programs.   Pt states she worked to bring down TG by cutting out chips, less fried foods, sticking with baked chicken.  Pt states she was trying to have an evening snack to address FBS, but her schedule has been very busy. She does better when Josh wants to eat breakfast, but sometimes has breakfast, but usually snack-ish  24-hr dietary recall: B: graham crackers S: none L: simple choice chicken and broccoli, water S: none D: (celebrate) baked chicken, broccoli, [place] toast mac & cheese S: none Bev: coke zero, water  Individualized Plan for Diabetes Self-Management  Training:   Learning Objective:  Patient will have a greater understanding of diabetes self-management. Patient education plan is to attend individual and/or group sessions per assessed needs and concerns.  Patient Instructions  Meal prep in the evening so you can have a good balanced breakfast. Consider drinking more water, get back to making infused water  To help with fasting blood sugar 10 min exercise in the evening, continue having dinner by 7:30 pm Having a 10 min bedtime routine to help you calm your mind before sleep.  Great job on staying focused on making healthy food choices and having vegetables daily!  Great job on lowering your triglycerides!  Expected Outcomes:   demonstrated interest in learning, expect positive change  Education material provided: none  If problems or questions, patient to contact team via:  Phone and MyChart  Future DSME appointment:  August

## 2023-04-10 ENCOUNTER — Ambulatory Visit: Payer: Medicaid Other | Admitting: Registered"

## 2023-04-12 ENCOUNTER — Ambulatory Visit: Payer: Medicaid Other | Admitting: Dietician

## 2023-05-29 ENCOUNTER — Other Ambulatory Visit: Payer: Self-pay

## 2023-05-29 ENCOUNTER — Emergency Department (HOSPITAL_COMMUNITY): Payer: Medicaid Other

## 2023-05-29 ENCOUNTER — Encounter (HOSPITAL_COMMUNITY): Payer: Self-pay

## 2023-05-29 ENCOUNTER — Emergency Department (HOSPITAL_COMMUNITY)
Admission: EM | Admit: 2023-05-29 | Discharge: 2023-05-29 | Disposition: A | Discharge status: Home / Self Care | Payer: Medicaid Other | Attending: Emergency Medicine | Admitting: Emergency Medicine

## 2023-05-29 DIAGNOSIS — Z1152 Encounter for screening for COVID-19: Secondary | ICD-10-CM | POA: Diagnosis not present

## 2023-05-29 DIAGNOSIS — J40 Bronchitis, not specified as acute or chronic: Secondary | ICD-10-CM | POA: Insufficient documentation

## 2023-05-29 DIAGNOSIS — E119 Type 2 diabetes mellitus without complications: Secondary | ICD-10-CM | POA: Insufficient documentation

## 2023-05-29 DIAGNOSIS — R0602 Shortness of breath: Secondary | ICD-10-CM | POA: Diagnosis present

## 2023-05-29 LAB — COMPREHENSIVE METABOLIC PANEL
ALT: 34 U/L (ref 0–44)
AST: 33 U/L (ref 15–41)
Albumin: 3.4 g/dL — ABNORMAL LOW (ref 3.5–5.0)
Alkaline Phosphatase: 84 U/L (ref 38–126)
Anion gap: 10 (ref 5–15)
BUN: 8 mg/dL (ref 6–20)
CO2: 20 mmol/L — ABNORMAL LOW (ref 22–32)
Calcium: 8.7 mg/dL — ABNORMAL LOW (ref 8.9–10.3)
Chloride: 106 mmol/L (ref 98–111)
Creatinine, Ser: 0.92 mg/dL (ref 0.44–1.00)
GFR, Estimated: >60 mL/min (ref >60)
Glucose, Bld: 147 mg/dL — ABNORMAL HIGH (ref 70–99)
Potassium: 3.7 mmol/L (ref 3.5–5.1)
Sodium: 136 mmol/L (ref 135–145)
Total Bilirubin: 0.3 mg/dL (ref 0.3–1.2)
Total Protein: 7.1 g/dL (ref 6.5–8.1)

## 2023-05-29 LAB — CBC
HCT: 39.3 % (ref 36.0–46.0)
Hemoglobin: 12.8 g/dL (ref 12.0–15.0)
MCH: 27.4 pg (ref 26.0–34.0)
MCHC: 32.6 g/dL (ref 30.0–36.0)
MCV: 84 fL (ref 80.0–100.0)
Platelets: 299 10*3/uL (ref 150–400)
RBC: 4.68 MIL/uL (ref 3.87–5.11)
RDW: 13 % (ref 11.5–15.5)
WBC: 4.7 10*3/uL (ref 4.0–10.5)
nRBC: 0 % (ref 0.0–0.2)

## 2023-05-29 LAB — RESP PANEL BY RT-PCR (RSV, FLU A&B, COVID)  RVPGX2
Influenza A by PCR: NEGATIVE
Influenza B by PCR: NEGATIVE
Resp Syncytial Virus by PCR: NEGATIVE
SARS Coronavirus 2 by RT PCR: NEGATIVE

## 2023-05-29 LAB — HCG, SERUM, QUALITATIVE: Preg, Serum: NEGATIVE

## 2023-05-29 MED ORDER — PREDNISONE 10 MG PO TABS: ORAL_TABLET | ORAL | 0 refills | Status: AC

## 2023-05-29 MED ORDER — BENZONATATE 100 MG PO CAPS: 100.0000 mg | ORAL_CAPSULE | Freq: Three times a day (TID) | ORAL | 0 refills | Status: AC

## 2023-05-29 NOTE — ED Provider Notes (Signed)
[place] EMERGENCY DEPARTMENT AT [facility] Provider Note   CSN: [id number] Arrival date & time: 05/29/23  1001     History  No chief complaint on file.   Katie Jordan is a 42 y.o. female with type 2 diabetes, who presents with concern for about 3 weeks of cough and episodic shortness of breath.  Occasionally coughs up some yellowish clear phlegm.  Does not feel like her symptoms are getting any better with over-the-counter cold and flu medications.  Reports some pain over her chest when she coughs.   HPI     Home Medications Prior to Admission medications   Medication Sig Start Date End Date Taking? Authorizing Provider  benzonatate (TESSALON) 100 MG capsule Take 1 capsule (100 mg total) by mouth every 8 (eight) hours. 05/29/23  Yes Arabella Merles, PA-C  predniSONE (DELTASONE) 10 MG tablet Take 4 tablets (40 mg total) by mouth daily with breakfast for 1 day, THEN 3 tablets (30 mg total) daily with breakfast for 2 days, THEN 2 tablets (20 mg total) daily with breakfast for 2 days, THEN 1 tablet (10 mg total) daily with breakfast for 2 days. 05/29/23 06/05/23 Yes Arabella Merles, PA-C  cyclobenzaprine (FLEXERIL) 10 MG tablet Take 1 tablet (10 mg total) by mouth 3 (three) times daily as needed for muscle spasms. Patient not taking: Reported on 02/06/2023 08/04/19   Calvert Cantor, CNM  Multiple Vitamins-Minerals (MULTIVITAMIN WOMEN PO) Take by mouth.    [provider]  VITAMIN D, CHOLECALCIFEROL, PO Take by mouth 2 (two) times a week.    [provider]      Allergies    Patient has no known allergies.    Review of Systems   Review of Systems  Respiratory:  Positive for cough.     Physical Exam Updated Vital Signs BP 121/80 (BP Location: Left Arm)   Pulse 80   Temp 98.9 F (37.2 C) (Oral)   Resp 20   Ht 4\' 9"  (1.448 m)   Wt 87.1 kg   SpO2 100%   BMI 41.55 kg/m  Physical Exam Vitals and nursing note reviewed.  Constitutional:       General: She is not in acute distress.    Appearance: She is well-developed.     Comments: With dry cough on exam, talking in full sentences  HENT:     Head: Normocephalic and atraumatic.  Eyes:     Conjunctiva/sclera: Conjunctivae normal.  Cardiovascular:     Rate and Rhythm: Normal rate and regular rhythm.     Heart sounds: No murmur heard. Pulmonary:     Effort: Pulmonary effort is normal. No respiratory distress.     Comments: Mild rhonchorous sounds in the upper lung fields bilaterally Abdominal:     Palpations: Abdomen is soft.     Tenderness: There is no abdominal tenderness.  Musculoskeletal:        General: No swelling.     Cervical back: Neck supple.  Skin:    General: Skin is warm and dry.     Capillary Refill: Capillary refill takes less than 2 seconds.  Neurological:     Mental Status: She is alert.  Psychiatric:        Mood and Affect: Mood normal.     ED Results / Procedures / Treatments   Labs (all labs ordered are listed, but only abnormal results are displayed) Labs Reviewed  COMPREHENSIVE METABOLIC PANEL - Abnormal; Notable for the following components:  Result Value   CO2 20 (*)    Glucose, Bld 147 (*)    Calcium 8.7 (*)    Albumin 3.4 (*)    All other components within normal limits  RESP PANEL BY RT-PCR (RSV, FLU A&B, COVID)  RVPGX2  CBC  HCG, SERUM, QUALITATIVE    EKG None  Radiology DG Chest Port 1 View  Result Date: 05/29/2023 CLINICAL DATA:  Shortness of breath EXAM: PORTABLE CHEST 1 VIEW COMPARISON:  None Available. FINDINGS: The cardiomediastinal silhouette is within normal limits. No pleural effusion. No pneumothorax. No mass or consolidation. No acute osseous abnormality. IMPRESSION: No acute findings in the chest. Electronically Signed   By: Olive Bass M.D.   On: 05/29/2023 11:51    Procedures Procedures    Medications Ordered in ED Medications - No data to display  ED Course/ Medical Decision Making/ A&P                                  Medical Decision Making Amount and/or Complexity of Data Reviewed Labs: ordered. Radiology: ordered.   42 y.o. female with pertinent past medical history of type II diabetes presents to the ED for concern of cough ongoing for the past 3 weeks  Differential diagnosis includes but is not limited to URI, COVID, bronchitis, pneumonia  ED Course:  Patient overall well-appearing with normal vital signs.  Talking full sentences on room air.  Has a mild dry cough.  Mild rhonchorous sounds in the upper lobes bilaterally.  Her chest x-ray showed no sign of pneumonia or pleural effusion.  COVID, flu, RSV negative.  CBC with no leukocytosis, CMP unremarkable.  Suspect bronchitis as a cause of patient's symptoms.  I discussed that she does not need an antibiotic at this time.  Will start on a short course of prednisone to help with her symptoms.  We discussed that she needs to monitor her blood sugars closely on this medication.  She reports she is well-controlled diabetic with diet and follows this closely with daily blood sugar monitoring and follow-ups with PCP.  Also prescribed Tessalon perles to help with cough.  We discussed following up closely with her PCP in the next 5 to 7 days for symptom recheck.   Impression: Bronchitis  Disposition:  The patient was discharged home with instructions to take prednisone and Tessalon Perles as prescribed.  Follow-up with PCP within the next 5 to 7 days for symptom recheck. Return precautions given.  Lab Tests: I Ordered, and personally interpreted labs.  The pertinent results include:   CBC without leukocytosis CMP unremarkable Flu, COVID, RSV negative  Imaging Studies ordered: I ordered imaging studies including chest x-ray I independently visualized the imaging with scope of interpretation limited to determining acute life threatening conditions related to emergency care. Imaging showed no consolidations, pleural effusions I  agree with the radiologist interpretation   Cardiac Monitoring: / EKG: The patient was maintained on a cardiac monitor.  I personally viewed and interpreted the cardiac monitored which showed an underlying rhythm of: Normal sinus rhythm    Co morbidities that complicate the patient evaluation  Type 2 diabetes            Final Clinical Impression(s) / ED Diagnoses Final diagnoses:  Bronchitis    Rx / DC Orders ED Discharge Orders          Ordered    benzonatate (TESSALON) 100 MG capsule  Every  8 hours        05/29/23 1426    predniSONE (DELTASONE) 10 MG tablet  Q breakfast        05/29/23 1426              Arabella Merles, PA-C 05/29/23 1445    Derwood Kaplan, MD 05/30/23 1423

## 2023-05-29 NOTE — ED Triage Notes (Signed)
Pt c/o productive cough w/white/yellow mucous, SOB, chest painx3wks. Pt has a hoarse voice.

## 2023-05-29 NOTE — Discharge Instructions (Addendum)
Your chest x-ray shows no signs of a pneumonia or fluid in the lungs.  Your COVID, flu, RSV is negative.  I suspect you have bronchitis.  You have been prescribed prednisone which is a steroid to help the inflammation in your lungs.  Please take this in the taper format as prescribed.  Please take this first thing in the morning as it can make you feel jittery and keep you up at night.  Please monitor your blood sugars closely.  If they rise above 400 and you start getting nausea or vomiting, dizziness, any other concerning symptoms, please stop this medication.   You have also been prescribed Tessalon (benzonatate) to help with your cough.  You may take this as needed every 8 hours.  Please follow-up with your PCP in 5 to 7 days for a recheck of your symptoms.  Return to the ER if you have uncontrolled nausea or vomiting, shortness of breath to where you cannot talk in full sentences, any other new or concerning symptoms.

## 2023-06-09 ENCOUNTER — Other Ambulatory Visit: Payer: Self-pay | Admitting: Nurse Practitioner

## 2023-06-09 ENCOUNTER — Ambulatory Visit
Admission: RE | Admit: 2023-06-09 | Discharge: 2023-06-09 | Disposition: A | Discharge status: Home / Self Care | Payer: Medicaid Other | Source: Ambulatory Visit | Attending: Nurse Practitioner | Admitting: Nurse Practitioner

## 2023-06-09 DIAGNOSIS — J209 Acute bronchitis, unspecified: Secondary | ICD-10-CM

## 2024-04-15 ENCOUNTER — Emergency Department (HOSPITAL_COMMUNITY): Admission: EM | Admit: 2024-04-15 | Discharge: 2024-04-16 | Disposition: A | Discharge status: Home / Self Care

## 2024-04-15 ENCOUNTER — Other Ambulatory Visit: Payer: Self-pay

## 2024-04-15 ENCOUNTER — Encounter (HOSPITAL_COMMUNITY): Payer: Self-pay

## 2024-04-15 DIAGNOSIS — W57XXXA Bitten or stung by nonvenomous insect and other nonvenomous arthropods, initial encounter: Secondary | ICD-10-CM | POA: Diagnosis not present

## 2024-04-15 DIAGNOSIS — S60461A Insect bite (nonvenomous) of left index finger, initial encounter: Secondary | ICD-10-CM | POA: Diagnosis present

## 2024-04-15 DIAGNOSIS — T63481A Toxic effect of venom of other arthropod, accidental (unintentional), initial encounter: Secondary | ICD-10-CM

## 2024-04-15 NOTE — ED Triage Notes (Signed)
 Patient states that left pointer was bitten by an insect and that now it is swollen and she can't bend it. Patient states it hurts really bad.

## 2024-04-15 NOTE — ED Triage Notes (Signed)
 Pt arrived from home via POV c/o bug in hair that she knocked out of hair with left pointer finger. Pt thinks she may have been bit. Pt states that now her left pointer finger is swollen and burns. Pt is not able to find a puncture site.

## 2024-04-16 NOTE — ED Notes (Signed)
 Patient discharged by RN, ambulatory to lobby

## 2024-04-16 NOTE — ED Provider Notes (Signed)
 [place] EMERGENCY DEPARTMENT AT [facility] Provider Note   CSN: [id number] Arrival date & time: 04/15/24  1941     Patient presents with: Insect Bite   Katie Jordan is a 43 y.o. female.    43 year old female presents to the emergency department for evaluation of insect sting.  She states that she was stung by a bug yesterday evening when she was trying to put up her hair.  States that it felt like electrical shocks went through her hand and arm after she was bit in.  Affected finger the left index.  Reports intermittent swelling of the finger which waxes and wanes.  Took ASA prior to arrival.   The history is provided by the patient. No language interpreter was used.       Prior to Admission medications   Medication Sig Start Date End Date Taking? Authorizing Provider  benzonatate  (TESSALON ) 100 MG capsule Take 1 capsule (100 mg total) by mouth every 8 (eight) hours. 05/29/23   Veta Palma, PA-C  cyclobenzaprine  (FLEXERIL ) 10 MG tablet Take 1 tablet (10 mg total) by mouth 3 (three) times daily as needed for muscle spasms. Patient not taking: Reported on 02/06/2023 08/04/19   Weinhold, Samantha C, CNM  Multiple Vitamins-Minerals (MULTIVITAMIN WOMEN PO) Take by mouth.    [provider]  VITAMIN D, CHOLECALCIFEROL, PO Take by mouth 2 (two) times a week.    [provider]    Allergies: Patient has no known allergies.    Review of Systems Ten systems reviewed and are negative for acute change, except as noted in the HPI.    Updated Vital Signs BP (!) 111/92 (BP Location: Left Arm)   Pulse 78   Temp 98 F (36.7 C)   Resp 18   Ht 5' 1 (1.549 m)   Wt 91.2 kg   LMP 04/10/2024   SpO2 100%   Breastfeeding No   BMI 37.98 kg/m   Physical Exam Vitals and nursing note reviewed.  Constitutional:      General: She is not in acute distress.    Appearance: She is well-developed. She is not diaphoretic.  HENT:     Head: Normocephalic  and atraumatic.  Eyes:     General: No scleral icterus.    Conjunctiva/sclera: Conjunctivae normal.  Cardiovascular:     Rate and Rhythm: Normal rate and regular rhythm.     Pulses: Normal pulses.     Comments: Distal radial pulse 2+ in the left upper extremity Pulmonary:     Effort: Pulmonary effort is normal. No respiratory distress.  Musculoskeletal:        General: Normal range of motion.     Cervical back: Normal range of motion.     Comments: No defects to the left hand or digits.  Specifically, no erythema, edema, purulent discharge or heat to touch.  No puncture wound identified. Normal ROM.  Skin:    General: Skin is warm and dry.     Coloration: Skin is not pale.     Findings: No erythema or rash.  Neurological:     Mental Status: She is alert and oriented to person, place, and time.     Coordination: Coordination normal.  Psychiatric:        Behavior: Behavior normal.     (all labs ordered are listed, but only abnormal results are displayed) Labs Reviewed - No data to display  EKG: None  Radiology: No results found.   Procedures   Medications  Ordered in the ED - No data to display                                  Medical Decision Making  This patient presents to the ED for concern of pain L index finger, this involves an extensive number of treatment options, and is a complaint that carries with it a high risk of complications and morbidity.  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  I personally viewed and interpreted the cardiac monitored which showed an underlying rhythm of: NSR   Medicines ordered and prescription drug management:  I have reviewed the patients home medicines and have made adjustments as needed   Test  Considered:  Xray L index for FB evaluation - felt low yield   Problem List / ED Course:  Seen for suspected insect bite/sting of L index finger. Neurovascularly intact without evidence of puncture wound. No concern for secondary infection, cellulitis, abscess. Preserved ROM of affected finger. Supportive measures indicated.   Reevaluation:  After the interventions noted above, I reevaluated the patient and found that they have :stayed the same   Dispostion:  After consideration of the diagnostic results and the patients response to treatment, I feel that the patent would benefit from NSAIDs, icing PRN. Return precautions discussed and provided. Patient discharged in stable condition with no unaddressed concerns.       Final diagnoses:  Local reaction to insect sting, accidental or unintentional, initial encounter    ED Discharge Orders     None          Keith Sor, PA-C 04/16/24 [id number]    Simon Lavonia SAILOR, MD 04/16/24 [phone]

## 2024-04-16 NOTE — Discharge Instructions (Signed)
 Continue tylenol  or ibuprofen  for pain. You may use Zyrtec or Claritin for itching or swelling, if this recurs. Return for new or concerning symptoms.
